# Patient Record
Sex: Female | Born: 1990 | Race: Black or African American | Hispanic: No | Marital: Single | Smoking: Never smoker
Health system: Southern US, Community
[De-identification: ages and names within clinical notes are randomized; demographics above are authoritative.]

## PROBLEM LIST (undated history)

## (undated) DIAGNOSIS — R52 Pain, unspecified: Secondary | ICD-10-CM

## (undated) DIAGNOSIS — M545 Low back pain, unspecified: Secondary | ICD-10-CM

## (undated) DIAGNOSIS — Z86711 Personal history of pulmonary embolism: Secondary | ICD-10-CM

## (undated) DIAGNOSIS — D649 Anemia, unspecified: Secondary | ICD-10-CM

## (undated) DIAGNOSIS — F1910 Other psychoactive substance abuse, uncomplicated: Secondary | ICD-10-CM

## (undated) DIAGNOSIS — N870 Mild cervical dysplasia: Secondary | ICD-10-CM

## (undated) DIAGNOSIS — A749 Chlamydial infection, unspecified: Secondary | ICD-10-CM

## (undated) DIAGNOSIS — F32A Depression, unspecified: Secondary | ICD-10-CM

## (undated) DIAGNOSIS — O98811 Other maternal infectious and parasitic diseases complicating pregnancy, first trimester: Secondary | ICD-10-CM

## (undated) DIAGNOSIS — F329 Major depressive disorder, single episode, unspecified: Secondary | ICD-10-CM

## (undated) DIAGNOSIS — Z79899 Other long term (current) drug therapy: Secondary | ICD-10-CM

## (undated) DIAGNOSIS — K219 Gastro-esophageal reflux disease without esophagitis: Secondary | ICD-10-CM

## (undated) DIAGNOSIS — N912 Amenorrhea, unspecified: Secondary | ICD-10-CM

## (undated) HISTORY — DX: Other maternal infectious and parasitic diseases complicating pregnancy, first trimester: O98.811

## (undated) HISTORY — DX: Mild cervical dysplasia: N87.0

## (undated) HISTORY — DX: Chlamydial infection, unspecified: A74.9

## (undated) HISTORY — DX: Personal history of pulmonary embolism: Z86.711

## (undated) HISTORY — PX: HIP SURGERY: SHX245

---
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
## 2011-03-28 ENCOUNTER — Emergency Department (HOSPITAL_COMMUNITY)
Admission: EM | Admit: 2011-03-28 | Discharge: 2011-03-29 | Disposition: A | Discharge status: Home / Self Care | Payer: Self-pay | Attending: Emergency Medicine | Admitting: Emergency Medicine

## 2011-03-28 ENCOUNTER — Encounter (HOSPITAL_COMMUNITY): Payer: Self-pay | Admitting: Emergency Medicine

## 2011-03-28 DIAGNOSIS — L259 Unspecified contact dermatitis, unspecified cause: Secondary | ICD-10-CM | POA: Insufficient documentation

## 2011-03-28 DIAGNOSIS — L309 Dermatitis, unspecified: Secondary | ICD-10-CM

## 2011-03-28 DIAGNOSIS — F3289 Other specified depressive episodes: Secondary | ICD-10-CM | POA: Insufficient documentation

## 2011-03-28 DIAGNOSIS — J45909 Unspecified asthma, uncomplicated: Secondary | ICD-10-CM | POA: Insufficient documentation

## 2011-03-28 DIAGNOSIS — L299 Pruritus, unspecified: Secondary | ICD-10-CM | POA: Insufficient documentation

## 2011-03-28 DIAGNOSIS — F329 Major depressive disorder, single episode, unspecified: Secondary | ICD-10-CM | POA: Insufficient documentation

## 2011-03-28 DIAGNOSIS — K219 Gastro-esophageal reflux disease without esophagitis: Secondary | ICD-10-CM | POA: Insufficient documentation

## 2011-03-28 DIAGNOSIS — Z79899 Other long term (current) drug therapy: Secondary | ICD-10-CM | POA: Insufficient documentation

## 2011-03-28 HISTORY — DX: Low back pain: M54.5

## 2011-03-28 HISTORY — DX: Major depressive disorder, single episode, unspecified: F32.9

## 2011-03-28 HISTORY — DX: Other psychoactive substance abuse, uncomplicated: F19.10

## 2011-03-28 HISTORY — DX: Anemia, unspecified: D64.9

## 2011-03-28 HISTORY — DX: Pain, unspecified: R52

## 2011-03-28 HISTORY — DX: Low back pain, unspecified: M54.50

## 2011-03-28 HISTORY — DX: Gastro-esophageal reflux disease without esophagitis: K21.9

## 2011-03-28 HISTORY — DX: Amenorrhea, unspecified: N91.2

## 2011-03-28 HISTORY — DX: Other long term (current) drug therapy: Z79.899

## 2011-03-28 HISTORY — DX: Depression, unspecified: F32.A

## 2011-03-28 NOTE — ED Notes (Signed)
PT. REPORTS GENERALIZED ITCHY RASHES ONSET THIS EVENING , DENIES NEW MEDICATIONS ,FOOD OR DETERGENT. RESPIRATIONS UNLABORED/ AIRWAY INTACT.

## 2011-03-29 MED ORDER — PREDNISONE 20 MG PO TABS
ORAL_TABLET | ORAL | Status: AC
  Filled 2011-03-29: qty 1

## 2011-03-29 MED ORDER — DIPHENHYDRAMINE HCL 50 MG/ML IJ SOLN
25.0000 mg | Freq: Once | INTRAMUSCULAR | Status: AC
  Administered 2011-03-29: 25 mg via INTRAVENOUS
  Filled 2011-03-29: qty 1

## 2011-03-29 MED ORDER — PREDNISONE 20 MG PO TABS: 40.0000 mg | ORAL_TABLET | Freq: Every day | ORAL | Status: AC

## 2011-03-29 MED ORDER — PREDNISONE 20 MG PO TABS
40.0000 mg | ORAL_TABLET | Freq: Once | ORAL | Status: AC
  Administered 2011-03-29: 40 mg via ORAL
  Filled 2011-03-29: qty 1

## 2011-03-29 NOTE — ED Provider Notes (Signed)
Medical screening examination/treatment/procedure(s) were performed by non-physician practitioner and as supervising physician I was immediately available for consultation/collaboration.  Troi Florendo K Jaysa Kise-Rasch, MD 03/29/11 0244 

## 2011-03-29 NOTE — ED Notes (Signed)
Pt c/o rash all over body with itching, onset approx 1 hr ago.  Does not know what she may be allergic to.  St's it feels like it is hard to breath.  All lung Jesus [place] clear at this time.

## 2011-03-29 NOTE — ED Provider Notes (Signed)
History     CSN: [id number] Arrival date & time: 03/28/2011 11:21 PM   First MD Initiated Contact with Patient 03/29/11 0036      Chief Complaint  Patient presents with  . Rash    (Consider location/radiation/quality/duration/timing/severity/associated sxs/prior treatment) HPI Comments: Patient here with a generalized itchy papular rash to entire body - she denies any new exposures to medication, detergents, perfumes.  Patient is a 20 y.o. female presenting with rash. The history is provided by the patient. No language interpreter was used.  Rash  This is a new problem. The current episode started yesterday. The problem has not changed since onset.The problem is associated with an unknown factor. There has been no fever. The rash is present on the scalp, head, torso, back, abdomen and trunk. The pain is at a severity of 0/10. The patient is experiencing no pain. Associated symptoms include itching. Pertinent negatives include no blisters, no pain and no weeping. She has tried nothing for the symptoms. The treatment provided no relief.    Past Medical History  Diagnosis Date  . Asthma   . Drug abuse, nondependent   . GERD (gastroesophageal reflux disease)   . Urinary incontinence   . Lumbago   . Anemia   . Amenorrhea   . Polypharmacy   . Depression   . Pain disorder     Past Surgical History  Procedure Date  . Hip surgery   . Leg surgery     No family history on file.  History  Substance Use Topics  . Smoking status: Never Smoker   . Smokeless tobacco: Not on file  . Alcohol Use: No    OB History    Grav Para Term Preterm Abortions TAB SAB Ect Mult Living                  Review of Systems  Skin: Positive for itching and rash.  All other systems reviewed and are negative.    Allergies  Ibuprofen and Keflex  Home Medications   Current Outpatient Rx  Name Route Sig Dispense Refill  . ALBUTEROL SULFATE HFA 108 (90 BASE) MCG/ACT IN AERS Inhalation  Inhale 2 puffs into the lungs every 6 (six) hours as needed. For wheezing     . APAP-PARABROM-PYRILAMINE 500-25-15 MG PO TABS Oral Take 1 tablet by mouth every 6 (six) hours as needed. For pain during menstrual cycle     . VITAMIN D3 2000 UNITS PO TABS Oral Take 1 tablet by mouth daily.      Marland Kitchen CLONAZEPAM 1 MG PO TABS Oral Take 1 mg by mouth 3 (three) times daily.      . DULOXETINE HCL 30 MG PO CPEP Oral Take 30 mg by mouth daily.      . DULOXETINE HCL 60 MG PO CPEP Oral Take 60 mg by mouth daily.      Marland Kitchen FEXOFENADINE-PSEUDOEPHED ER 180-240 MG PO TB24 Oral Take 1 tablet by mouth daily.      Marland Kitchen FLUTICASONE PROPIONATE 50 MCG/ACT NA SUSP Nasal Place 2 sprays into the nose daily.      Marland Kitchen GABAPENTIN 300 MG PO CAPS Oral Take 300 mg by mouth at bedtime.      Marland Kitchen GABAPENTIN 800 MG PO TABS Oral Take 800 mg by mouth 3 (three) times daily.      Marland Kitchen METHOCARBAMOL 500 MG PO TABS Oral Take 500 mg by mouth 3 (three) times daily.      Marland Kitchen MONTELUKAST SODIUM 10 MG PO  TABS Oral Take 10 mg by mouth daily.      Carma Leaven M PLUS PO TABS Oral Take 1 tablet by mouth daily.      Marland Kitchen OMEPRAZOLE 20 MG PO CPDR Oral Take 20 mg by mouth every morning.      Marland Kitchen ONDANSETRON HCL 4 MG PO TABS Oral Take 4 mg by mouth every 8 (eight) hours as needed. For nausea     . OXYBUTYNIN CHLORIDE 5 MG PO TABS Oral Take 5 mg by mouth 2 (two) times daily.      . OXYCODONE-ACETAMINOPHEN 5-325 MG PO TABS Oral Take 1-2 tablets by mouth 3 (three) times daily. Take 1 tab twice daily & 2 tabs at night     . PODOFILOX 0.5 % EX SOLN Topical Apply 1 application topically See admin instructions. Apply once daily x3 days then off for 4 days. Repeat x4 weeks.     Marland Kitchen PREGABALIN 150 MG PO CAPS Oral Take 150 mg by mouth 2 (two) times daily.      . TRAZODONE HCL 100 MG PO TABS Oral Take 100-200 mg by mouth at bedtime.      Marland Kitchen LIDOCAINE VISCOUS 2 % MT SOLN Oral Take 5-10 mLs by mouth at bedtime as needed. Gargle with 1-2 tsp at night if needed for sore throat       BP  132/70  Pulse 98  Temp(Src) 98.1 F (36.7 C) (Oral)  Resp 20  SpO2 96%  LMP 03/16/2011  Physical Exam  Nursing note and vitals reviewed. Constitutional: She is oriented to person, place, and time. She appears well-developed and well-nourished. No distress.  HENT:  Head: Normocephalic and atraumatic.  Right Ear: External ear normal.  Left Ear: External ear normal.  Mouth/Throat: No oropharyngeal exudate.  Eyes: Conjunctivae are normal. Pupils are equal, round, and reactive to light.  Neck: Normal range of motion. Neck supple.  Cardiovascular: Normal rate, regular rhythm and normal heart sounds.   Pulmonary/Chest: Effort normal and breath sounds normal. No respiratory distress. She has no wheezes.  Abdominal: Soft. Bowel sounds are normal. She exhibits no distension.  Musculoskeletal: Normal range of motion.  Neurological: She is alert and oriented to person, place, and time.  Skin: Skin is warm and dry. Rash noted.       Diffuse papular itchy rash to entire body    ED Course  Procedures (including critical care time)  Labs Reviewed - No data to display No results found.   Dermatitis    MDM  Patient with diffuse itchy rash - will discharge with benadryl and a course of steriods, I do not think this is scabies due to the presentation.       Izola Price [place], [place] 03/29/11 [phone]

## 2011-04-14 ENCOUNTER — Emergency Department (HOSPITAL_COMMUNITY)
Admission: EM | Admit: 2011-04-14 | Discharge: 2011-04-14 | Disposition: A | Discharge status: Home / Self Care | Payer: Self-pay | Attending: Emergency Medicine | Admitting: Emergency Medicine

## 2011-04-14 ENCOUNTER — Encounter (HOSPITAL_COMMUNITY): Payer: Self-pay | Admitting: Nurse Practitioner

## 2011-04-14 DIAGNOSIS — J069 Acute upper respiratory infection, unspecified: Secondary | ICD-10-CM | POA: Insufficient documentation

## 2011-04-14 DIAGNOSIS — R059 Cough, unspecified: Secondary | ICD-10-CM | POA: Insufficient documentation

## 2011-04-14 DIAGNOSIS — B370 Candidal stomatitis: Secondary | ICD-10-CM | POA: Insufficient documentation

## 2011-04-14 DIAGNOSIS — J45909 Unspecified asthma, uncomplicated: Secondary | ICD-10-CM | POA: Insufficient documentation

## 2011-04-14 DIAGNOSIS — J3489 Other specified disorders of nose and nasal sinuses: Secondary | ICD-10-CM | POA: Insufficient documentation

## 2011-04-14 DIAGNOSIS — R509 Fever, unspecified: Secondary | ICD-10-CM | POA: Insufficient documentation

## 2011-04-14 DIAGNOSIS — K219 Gastro-esophageal reflux disease without esophagitis: Secondary | ICD-10-CM | POA: Insufficient documentation

## 2011-04-14 DIAGNOSIS — R05 Cough: Secondary | ICD-10-CM

## 2011-04-14 MED ORDER — HYDROCOD POLST-CHLORPHEN POLST 10-8 MG/5ML PO LQCR: 5.0000 mL | Freq: Two times a day (BID) | ORAL | Status: DC | PRN

## 2011-04-14 MED ORDER — FLUCONAZOLE 100 MG PO TABS: 100.0000 mg | ORAL_TABLET | Freq: Every day | ORAL | Status: AC

## 2011-04-14 NOTE — ED Provider Notes (Signed)
History     CSN: [id number]  Arrival date & time 04/14/11  1751   First MD Initiated Contact with Patient 04/14/11 2049      Chief Complaint  Patient presents with  . Nasal Congestion  . URI    (Consider location/radiation/quality/duration/timing/severity/associated sxs/prior treatment) HPI Comments: Patient with 7 day history of upper respiratory symptoms. She was seen at another hospital and diagnosed with bronchitis after having a chest x-ray. Patient was discharged home on azithromycin. She continues to have intermittent fevers, cough, nasal congestion. She denies vomiting. Patient has an albuterol inhaler that she has not been using. Patient subsequently developed oral thrush after taking antibiotics. She says this has happened to her before after taking Keflex.  Patient is a 20 y.o. female presenting with URI. The history is provided by the patient.  URI The primary symptoms include fever, fatigue, ear pain, sore throat, cough, wheezing and nausea. Primary symptoms do not include headaches, swollen glands, abdominal pain, vomiting, myalgias or rash. The current episode started more than 1 week ago.  Symptoms associated with the illness include congestion and rhinorrhea. The illness is not associated with chills.    Past Medical History  Diagnosis Date  . Asthma   . Drug abuse, nondependent   . GERD (gastroesophageal reflux disease)   . Urinary incontinence   . Lumbago   . Anemia   . Amenorrhea   . Polypharmacy   . Depression   . Pain disorder     Past Surgical History  Procedure Date  . Hip surgery   . Leg surgery     History reviewed. No pertinent family history.  History  Substance Use Topics  . Smoking status: Never Smoker   . Smokeless tobacco: Not on file  . Alcohol Use: No    OB History    Grav Para Term Preterm Abortions TAB SAB Ect Mult Living                  Review of Systems  Constitutional: Positive for fever and fatigue. Negative for  chills.  HENT: Positive for ear pain, congestion, sore throat and rhinorrhea.   Eyes: Negative for discharge.  Respiratory: Positive for cough and wheezing. Negative for shortness of breath.   Cardiovascular: Negative for chest pain.  Gastrointestinal: Positive for nausea. Negative for vomiting, abdominal pain, diarrhea and constipation.  Genitourinary: Negative for dysuria and hematuria.  Musculoskeletal: Negative for myalgias.  Skin: Negative for rash.  Neurological: Negative for headaches.  Psychiatric/Behavioral: Negative for confusion.    Allergies  Ibuprofen and Keflex  Home Medications   Current Outpatient Rx  Name Route Sig Dispense Refill  . ACETAMINOPHEN 500 MG PO TABS Oral Take 1,000 mg by mouth every 6 (six) hours as needed. For pain     . ALBUTEROL SULFATE HFA 108 (90 BASE) MCG/ACT IN AERS Inhalation Inhale 2 puffs into the lungs every 6 (six) hours as needed. For wheezing    . VITAMIN D3 2000 UNITS PO TABS Oral Take 1 tablet by mouth daily.      Marland Kitchen CLONAZEPAM 1 MG PO TABS Oral Take 1 mg by mouth 3 (three) times daily.      Marland Kitchen DICLOFENAC SODIUM 1 % TD GEL Topical Apply 1 application topically 4 (four) times daily as needed. For pain in chest     . FERROUS SULFATE 325 (65 FE) MG PO TABS Oral Take 325 mg by mouth daily with breakfast.      . FEXOFENADINE-PSEUDOEPHED ER 180-240 MG  PO TB24 Oral Take 1 tablet by mouth daily.      Marland Kitchen FLUTICASONE PROPIONATE 50 MCG/ACT NA SUSP Nasal Place 2 sprays into the nose daily.      Marland Kitchen GABAPENTIN 300 MG PO CAPS Oral Take 300 mg by mouth at bedtime.      Marland Kitchen GABAPENTIN 400 MG PO CAPS Oral Take 400 mg by mouth 3 (three) times daily.      Marland Kitchen HYDROXYZINE HCL 25 MG PO TABS Oral Take 25 mg by mouth 3 (three) times daily.      Marland Kitchen LIDOCAINE VISCOUS 2 % MT SOLN Oral Take 5-10 mLs by mouth at bedtime as needed. Gargle with 1-2 tsp at night if needed for sore throat     . METHOCARBAMOL 500 MG PO TABS Oral Take 500 mg by mouth 3 (three) times daily.      Marland Kitchen  MONTELUKAST SODIUM 10 MG PO TABS Oral Take 10 mg by mouth daily.      Carma Leaven M PLUS PO TABS Oral Take 1 tablet by mouth daily.      Marland Kitchen OMEPRAZOLE 20 MG PO CPDR Oral Take 20 mg by mouth every morning.      Marland Kitchen ONDANSETRON 4 MG PO TBDP Oral Take 4 mg by mouth 2 (two) times daily.      . OXYBUTYNIN CHLORIDE 5 MG PO TABS Oral Take 5 mg by mouth 2 (two) times daily.      . OXYCODONE HCL 5 MG PO TABS Oral Take 5-10 mg by mouth See admin instructions. For pain.  Take 1 tablet by mouth twice daily and 2 tablets by mouth at bedtime     . PREDNISONE 10 MG PO TABS Oral Take 10 mg by mouth daily.      Marland Kitchen PREGABALIN 150 MG PO CAPS Oral Take 150 mg by mouth 2 (two) times daily.      Marland Kitchen PROMETHAZINE-CODEINE 6.25-10 MG/5ML PO SYRP Oral Take 5 mLs by mouth every 4 (four) hours as needed. For nausea     . SENNOSIDES-DOCUSATE SODIUM 8.6-50 MG PO TABS Oral Take 1 tablet by mouth 2 (two) times daily.      . TRAZODONE HCL 100 MG PO TABS Oral Take 200 mg by mouth at bedtime.     . VENLAFAXINE HCL 37.5 MG PO CP24 Oral Take 37.5 mg by mouth daily.      Marland Kitchen APAP-PARABROM-PYRILAMINE 500-25-15 MG PO TABS Oral Take 1 tablet by mouth every 6 (six) hours as needed. For pain during menstrual cycle     . HYDROCOD POLST-CHLORPHEN POLST 10-8 MG/5ML PO LQCR Oral Take 5 mLs by mouth every 12 (twelve) hours as needed. 115 mL 0  . FLUCONAZOLE 100 MG PO TABS Oral Take 1 tablet (100 mg total) by mouth daily. Take 2 tabs PO on day one and 1 tab PO days 2-7 8 tablet 0  . PODOFILOX 0.5 % EX SOLN Topical Apply 1 application topically 2 (two) times daily.       BP 120/71  Pulse 102  Temp(Src) 99.1 F (37.3 C) (Oral)  Resp 20  Ht 5\' 1"  (1.549 m)  Wt 230 lb (104.327 kg)  BMI 43.46 kg/m2  SpO2 96%  LMP 03/16/2011  Physical Exam  Nursing note and vitals reviewed. Constitutional: She is oriented to person, place, and time. She appears well-developed and well-nourished.  HENT:  Head: Normocephalic and atraumatic.  Right Ear: Tympanic  membrane and external ear normal.  Left Ear: Tympanic membrane and external ear normal.  Nose: Rhinorrhea present. No mucosal edema.  Mouth/Throat: Uvula is midline. Posterior oropharyngeal erythema present. No oropharyngeal exudate.       Patient with white coating on her tongue consistent with oral candidiasis.  Eyes: Pupils are equal, round, and reactive to light. Right eye exhibits no discharge. Left eye exhibits no discharge.  Neck: Normal range of motion. Neck supple.  Cardiovascular: Normal rate and regular rhythm.  Exam reveals no gallop and no friction rub.   No murmur heard. Pulmonary/Chest: Effort normal. No respiratory distress. She has wheezes.       Patient with mild diffuse expiratory wheezing  Abdominal: Soft. There is no tenderness. There is no rebound and no guarding.  Musculoskeletal: Normal range of motion.  Neurological: She is alert and oriented to person, place, and time.  Skin: Skin is warm and dry. No rash noted.  Psychiatric: She has a normal mood and affect.    ED Course  Procedures (including critical care time)  Labs Reviewed - No data to display No results found.   1. Viral upper respiratory illness   2. Cough   3. Oral candidiasis    9:54 PM patient seen and examined. Will give medication for cough and oral thrush. Patient urged to use inhaler every 6 hours for the next 2-3 days for symptoms. Patient counseled on supportive care for viral URI and s/s to return including worsening symptoms, persistent fever, persistent vomiting, or if they have any other concerns.  Urged to see PCP if symptoms persist for more than 3 days. Patient verbalizes understanding and agrees with plan.   9:54 PM Patient counseled on use of narcotic pain medications. Counseled not to combine these medications with others containing tylenol. Urged not to drink alcohol, drive, or perform any other activities that requires focus while taking these medications. The patient verbalizes  understanding and agrees with the plan.   MDM  Patient with symptoms consistent with a viral syndrome.  Oral thrush is secondary to previous antibiotic use. Patient was treated with azithromycin. I do not suspect a secondary bacterial infection at this time. Vitals are stable, no fever.  No signs of dehydration.  Lung exam normal, no signs of pneumonia.  Supportive therapy indicated with return if symptoms worsen.     Medical screening examination/treatment/procedure(s) were performed by non-physician practitioner and as supervising physician I was immediately available for consultation/collaboration. Osvaldo Human, M.D.       Eustace Moore [place], [place] 04/14/11 2200  Carleene Cooper III, MD 04/15/11 [phone]

## 2011-04-14 NOTE — ED Notes (Signed)
Reports diagnosed with bronchitis at [facility] on 12/24, completed a course of azithromycin with no relief of symptoms. C/o weakness, cough, chills, nausea, body aches, not feeling well still

## 2011-04-14 NOTE — ED Notes (Signed)
Patient was seen at a different hospital on the 25th and diagnosed with bronchitis and give azithromycin (patient has finished prescription); patient states that ever since she was diagnosed, her symptoms have been getting worse.  Patient complaining of chest congestion, nasal congestion, headache, productive cough, burning/watery eyes, low back pain, sore throat, and nausea.  Upon assessment, patient actively coughing.  Expiratory wheezing heard bilaterally upon ascultation.  Patient alert and oriented x4; PERRL present.  Will continue to monitor.

## 2011-04-14 NOTE — ED Notes (Signed)
Patient instructed to drink plenty of fluids, to take medications as directed, to follow up with primary care physician, and to return to the ED for new, worsening, or concerning symptoms.

## 2011-09-05 LAB — OB RESULTS CONSOLE VARICELLA ZOSTER ANTIBODY, IGG: VARICELLA IGG: IMMUNE

## 2011-10-22 ENCOUNTER — Emergency Department (HOSPITAL_COMMUNITY)
Admission: EM | Admit: 2011-10-22 | Discharge: 2011-10-22 | Disposition: A | Discharge status: Home / Self Care | Payer: Self-pay | Attending: Emergency Medicine | Admitting: Emergency Medicine

## 2011-10-22 ENCOUNTER — Encounter (HOSPITAL_COMMUNITY): Payer: Self-pay | Admitting: Emergency Medicine

## 2011-10-22 DIAGNOSIS — J45909 Unspecified asthma, uncomplicated: Secondary | ICD-10-CM | POA: Insufficient documentation

## 2011-10-22 DIAGNOSIS — Z79899 Other long term (current) drug therapy: Secondary | ICD-10-CM | POA: Insufficient documentation

## 2011-10-22 DIAGNOSIS — F4310 Post-traumatic stress disorder, unspecified: Secondary | ICD-10-CM | POA: Insufficient documentation

## 2011-10-22 DIAGNOSIS — R5381 Other malaise: Secondary | ICD-10-CM | POA: Insufficient documentation

## 2011-10-22 DIAGNOSIS — K219 Gastro-esophageal reflux disease without esophagitis: Secondary | ICD-10-CM | POA: Insufficient documentation

## 2011-10-22 DIAGNOSIS — R5383 Other fatigue: Secondary | ICD-10-CM | POA: Insufficient documentation

## 2011-10-22 LAB — URINALYSIS, ROUTINE W REFLEX MICROSCOPIC
Bilirubin Urine: NEGATIVE
Hgb urine dipstick: NEGATIVE
Ketones, ur: 15 mg/dL — AB
Protein, ur: NEGATIVE mg/dL
Urobilinogen, UA: 1 mg/dL (ref 0.0–1.0)

## 2011-10-22 LAB — RAPID URINE DRUG SCREEN, HOSP PERFORMED
Amphetamines: NOT DETECTED
Barbiturates: NOT DETECTED
Benzodiazepines: POSITIVE — AB
Cocaine: NOT DETECTED
Tetrahydrocannabinol: NOT DETECTED

## 2011-10-22 LAB — COMPREHENSIVE METABOLIC PANEL
ALT: 14 U/L (ref 0–35)
AST: 20 U/L (ref 0–37)
Alkaline Phosphatase: 69 U/L (ref 39–117)
Calcium: 9.8 mg/dL (ref 8.4–10.5)
Potassium: 3.4 mEq/L — ABNORMAL LOW (ref 3.5–5.1)
Sodium: 138 mEq/L (ref 135–145)
Total Protein: 7.9 g/dL (ref 6.0–8.3)

## 2011-10-22 LAB — URINE MICROSCOPIC-ADD ON

## 2011-10-22 LAB — CBC
MCH: 28.3 pg (ref 26.0–34.0)
MCHC: 34.7 g/dL (ref 30.0–36.0)
Platelets: 354 10*3/uL (ref 150–400)
RBC: 4.81 MIL/uL (ref 3.87–5.11)

## 2011-10-22 NOTE — ED Notes (Signed)
Patient states she has been missing some doses of her medications due to her work schedule and now she is feeling dehydrated and tired all the time and the patient states she wants to make sure nothing bad is going on with her. Patient denies SI and denies HI.

## 2011-10-22 NOTE — ED Provider Notes (Signed)
History     CSN: [id number]  Arrival date & time 10/22/11  1443   First MD Initiated Contact with Patient 10/22/11 1642      Chief Complaint  Patient presents with  . Medical Clearance    (Consider location/radiation/quality/duration/timing/severity/associated sxs/prior treatment) HPI Comments: Patient with a history of depression and PTSD comes in complaining of general fatigue. She states that she's been working long hours and leaves early in the morning doesn't get home until late at night so she hasn't been taking her medication regularly and she just feels tired and fatigued. She denies any hallucinations, suicidal or homicidal ideations. She actually denies any psychiatric complaints she just complains of fatigue. She denies any recent illnesses. The fatigue has been going on for the last several weeks and seems to be waxing and waning intensity. She denies any fevers, cough, congestion, nausea vomiting diarrhea, or any UTI symptoms. Denies any abdominal pain. She's having regular menstrual periods.  The history is provided by the patient.    Past Medical History  Diagnosis Date  . Asthma   . Drug abuse, nondependent   . GERD (gastroesophageal reflux disease)   . Urinary incontinence   . Lumbago   . Anemia   . Amenorrhea   . Polypharmacy   . Depression   . Pain disorder     Past Surgical History  Procedure Date  . Hip surgery   . Leg surgery     History reviewed. No pertinent family history.  History  Substance Use Topics  . Smoking status: Never Smoker   . Smokeless tobacco: Not on file  . Alcohol Use: No    OB History    Grav Para Term Preterm Abortions TAB SAB Ect Mult Living                  Review of Systems  Constitutional: Positive for fatigue. Negative for fever, chills and diaphoresis.  HENT: Negative for congestion, rhinorrhea and sneezing.   Eyes: Negative.   Respiratory: Negative for cough, chest tightness and shortness of breath.     Cardiovascular: Negative for chest pain and leg swelling.  Gastrointestinal: Negative for nausea, vomiting, abdominal pain, diarrhea and blood in stool.  Genitourinary: Negative for frequency, hematuria, flank pain and difficulty urinating.  Musculoskeletal: Negative for back pain and arthralgias.  Skin: Negative for rash.  Neurological: Negative for dizziness, speech difficulty, weakness, numbness and headaches.  Psychiatric/Behavioral: Negative for suicidal ideas, hallucinations, behavioral problems, confusion and agitation. The patient is not nervous/anxious.     Allergies  Ibuprofen and Keflex  Home Medications   Current Outpatient Rx  Name Route Sig Dispense Refill  . ACETAMINOPHEN 500 MG PO TABS Oral Take 1,000 mg by mouth every 6 (six) hours as needed. For pain     . APAP-PARABROM-PYRILAMINE 500-25-15 MG PO TABS Oral Take 1 tablet by mouth every 6 (six) hours as needed. For pain during menstrual cycle     . CLONAZEPAM 1 MG PO TABS Oral Take 1 mg by mouth 3 (three) times daily.      Marland Kitchen FLUTICASONE PROPIONATE 50 MCG/ACT NA SUSP Nasal Place 2 sprays into the nose daily.      Marland Kitchen GABAPENTIN 300 MG PO CAPS Oral Take 300 mg by mouth at bedtime.      Marland Kitchen GABAPENTIN 400 MG PO CAPS Oral Take 400 mg by mouth 3 (three) times daily.      Marland Kitchen HYDROXYZINE HCL 25 MG PO TABS Oral Take 25 mg by mouth  3 (three) times daily.      Marland Kitchen LIDOCAINE VISCOUS 2 % MT SOLN Oral Take 5-10 mLs by mouth at bedtime as needed. Gargle with 1-2 tsp at night if needed for sore throat     . LORATADINE-PSEUDOEPHEDRINE ER 10-240 MG PO TB24 Oral Take 1 tablet by mouth daily.    Marland Kitchen METHOCARBAMOL 500 MG PO TABS Oral Take 500 mg by mouth 3 (three) times daily.      Marland Kitchen MONTELUKAST SODIUM 10 MG PO TABS Oral Take 10 mg by mouth daily.      Marland Kitchen OMEPRAZOLE 20 MG PO CPDR Oral Take 20 mg by mouth 2 (two) times daily.     Marland Kitchen ONDANSETRON 4 MG PO TBDP Oral Take 4 mg by mouth 2 (two) times daily.      . OXYBUTYNIN CHLORIDE 5 MG PO TABS Oral Take  5 mg by mouth 2 (two) times daily.      Marland Kitchen PREGABALIN 150 MG PO CAPS Oral Take 150 mg by mouth 2 (two) times daily.      Marland Kitchen PRENATAL MULTIVITAMIN CH Oral Take 1 tablet by mouth daily.    Bernadette Hoit SODIUM 8.6-50 MG PO TABS Oral Take 1 tablet by mouth 2 (two) times daily.      . SUMATRIPTAN SUCCINATE 25 MG PO TABS Oral Take 25 mg by mouth 3 (three) times daily as needed. For headaches    . TRAZODONE HCL 100 MG PO TABS Oral Take 200 mg by mouth at bedtime.     . ALBUTEROL SULFATE HFA 108 (90 BASE) MCG/ACT IN AERS Inhalation Inhale 2 puffs into the lungs every 6 (six) hours as needed. For wheezing      BP 106/59  Pulse 73  Temp 98.8 F (37.1 C) (Oral)  Resp 20  SpO2 100%  Physical Exam  Constitutional: She is oriented to person, place, and time. She appears well-developed and well-nourished.  HENT:  Head: Normocephalic and atraumatic.  Eyes: Pupils are equal, round, and reactive to light.  Neck: Normal range of motion. Neck supple.  Cardiovascular: Normal rate, regular rhythm and normal heart sounds.   Pulmonary/Chest: Effort normal and breath sounds normal. No respiratory distress. She has no wheezes. She has no rales. She exhibits no tenderness.  Abdominal: Soft. Bowel sounds are normal. There is no tenderness. There is no rebound and no guarding.  Musculoskeletal: Normal range of motion. She exhibits no edema.  Lymphadenopathy:    She has no cervical adenopathy.  Neurological: She is alert and oriented to person, place, and time.  Skin: Skin is warm and dry. No rash noted.  Psychiatric: She has a normal mood and affect.    ED Course  Procedures (including critical care time)  Results for orders placed during the hospital encounter of 10/22/11  CBC      Component Value Range   WBC 7.9  4.0 - 10.5 K/uL   RBC 4.81  3.87 - 5.11 MIL/uL   Hemoglobin 13.6  12.0 - 15.0 g/dL   HCT 16.1  09.6 - 04.5 %   MCV 81.5  78.0 - 100.0 fL   MCH 28.3  26.0 - 34.0 pg   MCHC 34.7  30.0  - 36.0 g/dL   RDW 40.9  81.1 - 91.4 %   Platelets 354  150 - 400 K/uL  COMPREHENSIVE METABOLIC PANEL      Component Value Range   Sodium 138  135 - 145 mEq/L   Potassium 3.4 (*) 3.5 - 5.1 mEq/L  Chloride 101  96 - 112 mEq/L   CO2 26  19 - 32 mEq/L   Glucose, Bld 88  70 - 99 mg/dL   BUN 9  6 - 23 mg/dL   Creatinine, Ser 0.98  0.50 - 1.10 mg/dL   Calcium 9.8  8.4 - 11.9 mg/dL   Total Protein 7.9  6.0 - 8.3 g/dL   Albumin 4.2  3.5 - 5.2 g/dL   AST 20  0 - 37 U/L   ALT 14  0 - 35 U/L   Alkaline Phosphatase 69  39 - 117 U/L   Total Bilirubin 0.3  0.3 - 1.2 mg/dL   GFR calc non Af Amer >90  >90 mL/min   GFR calc Af Amer >90  >90 mL/min  ETHANOL      Component Value Range   Alcohol, Ethyl (B) <11  0 - 11 mg/dL  URINE RAPID DRUG SCREEN (HOSP PERFORMED)      Component Value Range   Opiates NONE DETECTED  NONE DETECTED   Cocaine NONE DETECTED  NONE DETECTED   Benzodiazepines POSITIVE (*) NONE DETECTED   Amphetamines NONE DETECTED  NONE DETECTED   Tetrahydrocannabinol NONE DETECTED  NONE DETECTED   Barbiturates NONE DETECTED  NONE DETECTED  POCT PREGNANCY, URINE      Component Value Range   Preg Test, Ur NEGATIVE  NEGATIVE  URINALYSIS, ROUTINE W REFLEX MICROSCOPIC      Component Value Range   Color, Urine AMBER (*) YELLOW   APPearance HAZY (*) CLEAR   Specific Gravity, Urine 1.028  1.005 - 1.030   pH 6.0  5.0 - 8.0   Glucose, UA NEGATIVE  NEGATIVE mg/dL   Hgb urine dipstick NEGATIVE  NEGATIVE   Bilirubin Urine NEGATIVE  NEGATIVE   Ketones, ur 15 (*) NEGATIVE mg/dL   Protein, ur NEGATIVE  NEGATIVE mg/dL   Urobilinogen, UA 1.0  0.0 - 1.0 mg/dL   Nitrite NEGATIVE  NEGATIVE   Leukocytes, UA TRACE (*) NEGATIVE  URINE MICROSCOPIC-ADD ON      Component Value Range   Squamous Epithelial / LPF FEW (*) RARE   WBC, UA 0-2  <3 WBC/hpf   Bacteria, UA RARE  RARE   Urine-Other MUCOUS PRESENT     No results found.    1. Fatigue       MDM  Patient is well-appearing, is here  with her dad, there is no apparent psychiatric issues. Her blood work and urine looks okay. Advise her to followup with her primary care physician return here as needed for any worsening symptoms        Rolan Bucco, MD 10/22/11 [phone]

## 2011-10-22 NOTE — ED Notes (Signed)
The patient is AOx4 and comfortable with her discharge instructions. 

## 2011-10-22 NOTE — ED Notes (Signed)
Pt here for eval because sts she has not been taking her mental health meds as she should and is now having trouble eating of functioning; pt sts feels tired; pt denies SI/HI

## 2011-10-22 NOTE — ED Notes (Signed)
Visitor escorted back to patient's room to visit patient; visitor pass given.

## 2012-06-07 IMAGING — CT CT ANGIO CHEST
2 of 6 series · 19 of 36 positions shown · IV contrast (APPLIED)
Comparison: None.

CLINICAL DATA: Chest pain

CT ANGIOGRAPHY CHEST WITH CONTRAST
TECHNIQUE: Multidetector CT imaging of the chest was performed
using the standard protocol during bolus administration of
intravenous contrast.  Multiplanar CT image reconstructions
including MIPs were obtained to evaluate the vascular anatomy.
Contrast:  100 ml of Dmnipaque-7GG

[Series 10: pulm embolism 1.0 b25f thins · axial · 0.70mm/px · z∈[-138,+66]mm · 18 of 228 slices shown]
[im 12/228  lung]
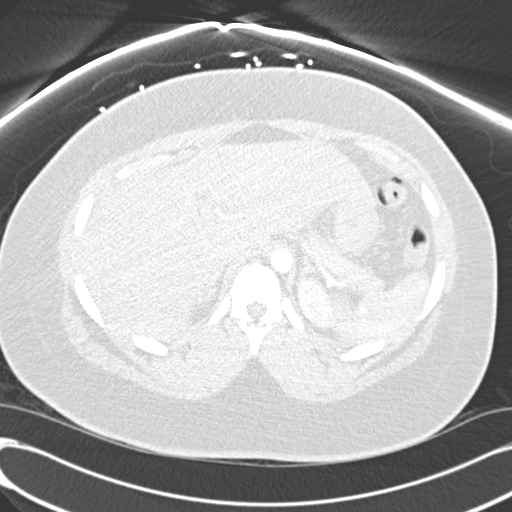
[im 23/228  mediastinal]
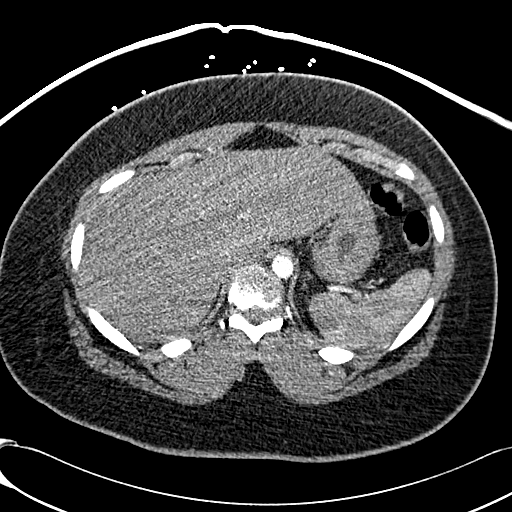
[im 35/228  lung]
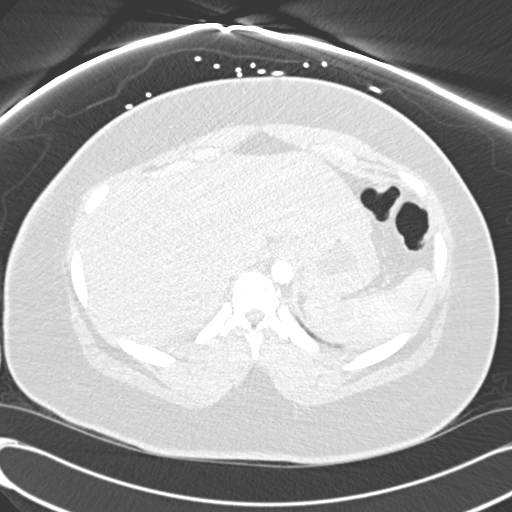
[im 46/228  mediastinal]
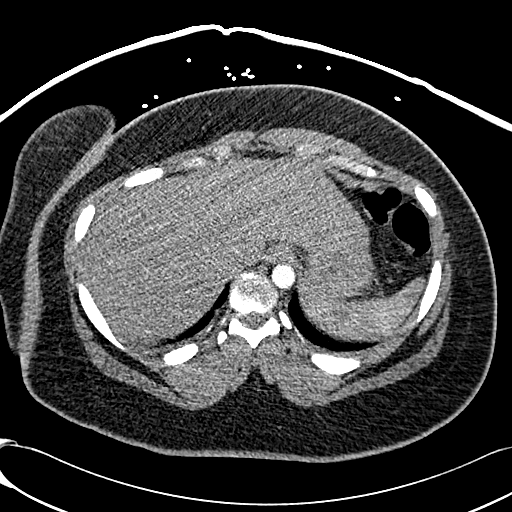
[im 57/228  lung]
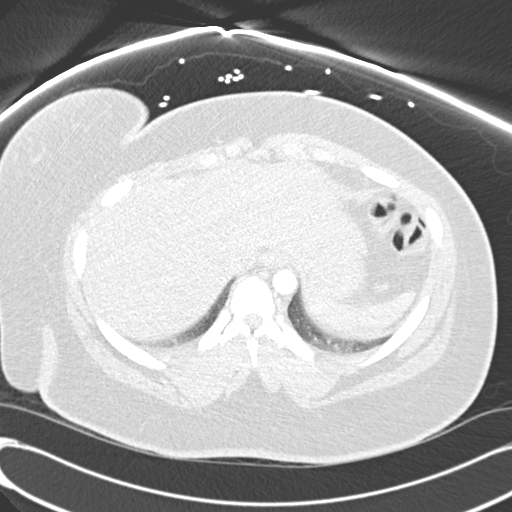
[im 69/228  mediastinal]
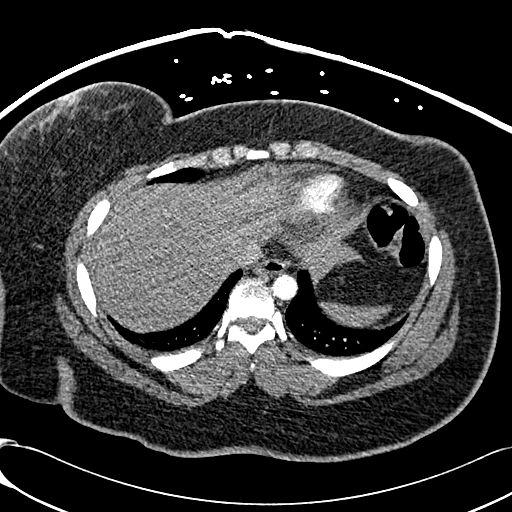
[im 80/228  lung]
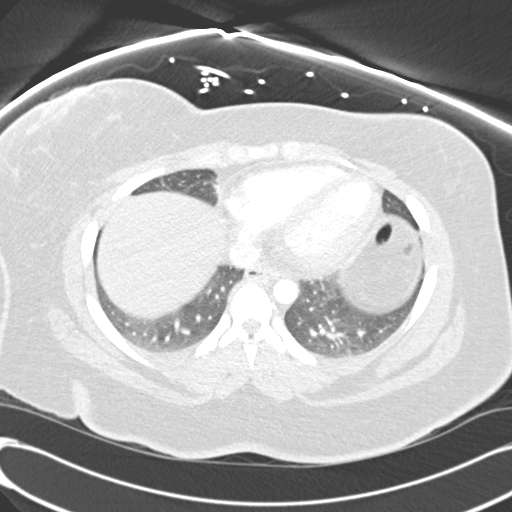
[im 91/228  mediastinal]
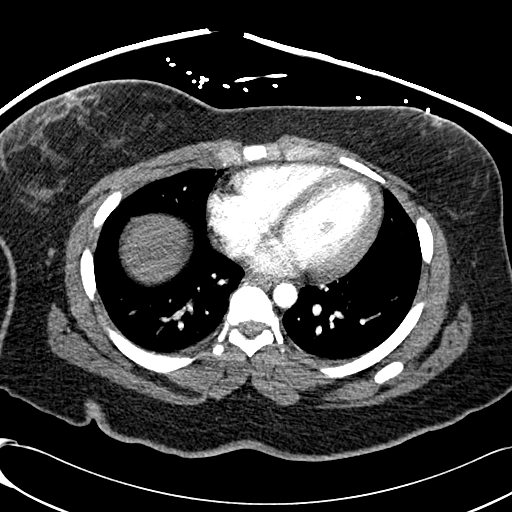
[im 103/228  lung]
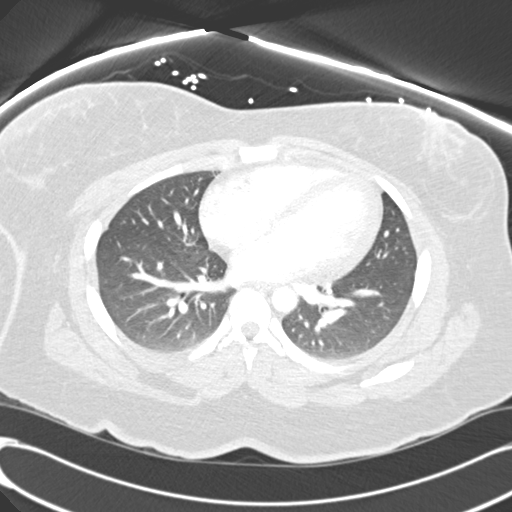
[im 125/228  mediastinal]
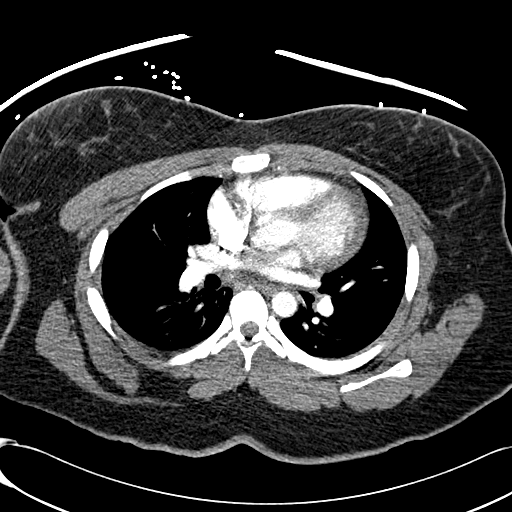
[im 137/228  lung]
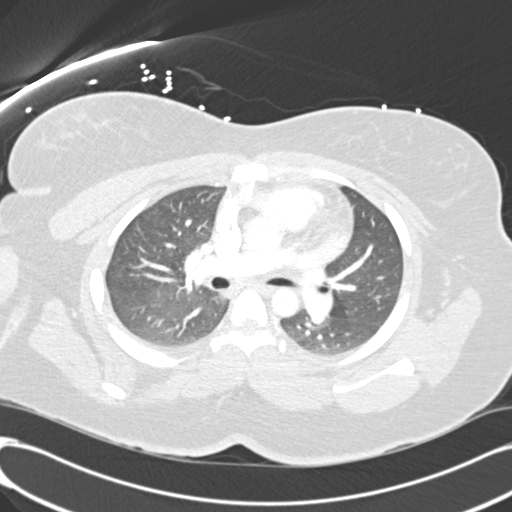
[im 148/228  mediastinal]
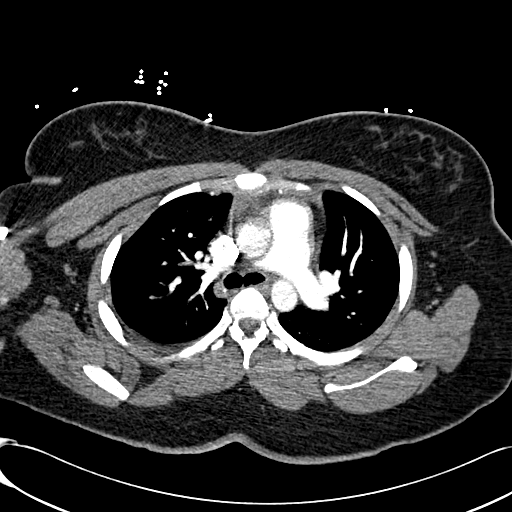
[im 159/228  lung]
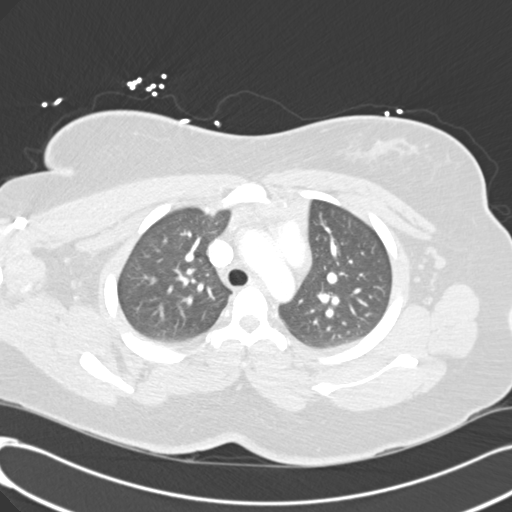
[im 171/228  mediastinal]
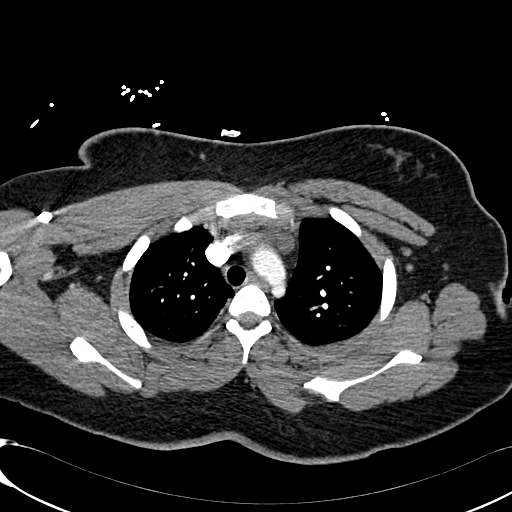
[im 182/228  lung]
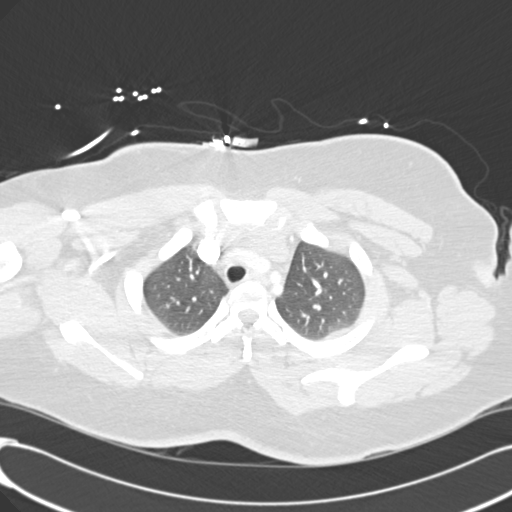
[im 193/228  mediastinal]
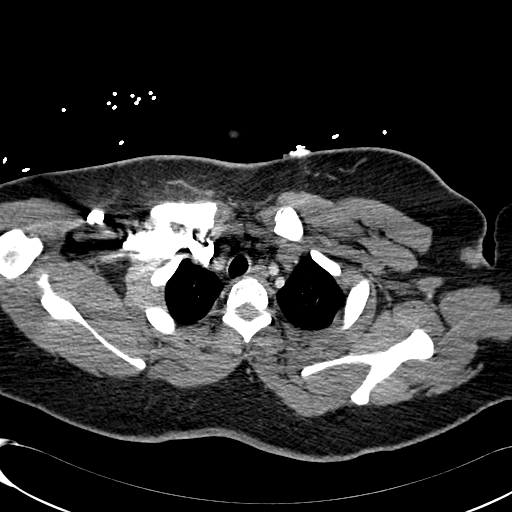
[im 205/228  lung]
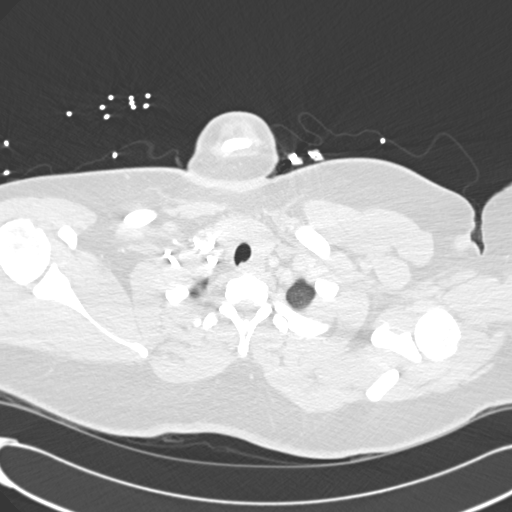
[im 216/228  mediastinal]
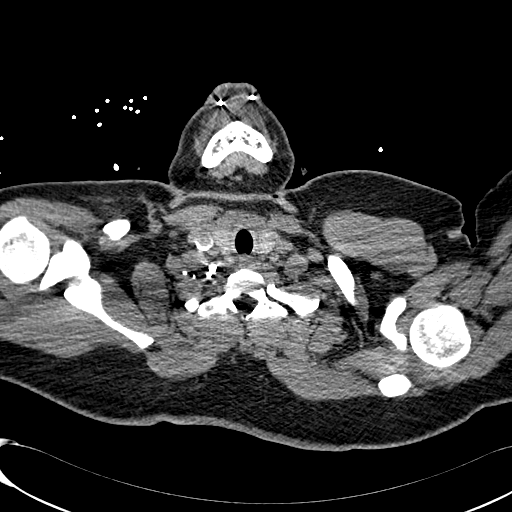

[Series 604: coronal · coronal · 0.70mm/px · 1 of 66 slices shown]
[im 33/66  mediastinal]
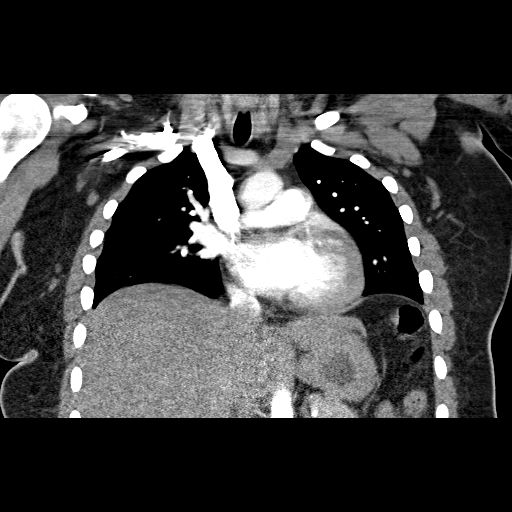

[19 of 36 positions shown; findings below may reference images not displayed]

FINDINGS: No filling defects are seen in the central, segmental or
subsegmental pulmonary arteries.  The pulmonary artery is normal in
size.  The heart is also normal in size without pericardial
effusion.  The aorta and major branch vessels are patent.  The
visualized thyroid is normal.  No mediastinal or hilar adenopathy
is present.  There is also no axillary adenopathy.  The lungs are
clear.  Minimal dependent atelectasis is seen bilaterally.  The
trachea and central airways are patent.  There are no pleural
effusions.

No aggressive lytic or blastic bony lesions are seen.  There is no
acute fracture.  A vague area of enhancement is seen in the
anterior aspect of the right hepatic lobe measuring approximately
10 mm, likely, a flash fill hemangioma.  The upper abdomen is
otherwise normal.

Review of the MIP images confirms the above findings.
IMPRESSION: 1.  No acute findings in the abdomen.  Specifically, no pulmonary
embolism is seen.
2.  Hypervascular liver lesion, likely, a flash fill hemangioma.

## 2012-07-20 DIAGNOSIS — I2699 Other pulmonary embolism without acute cor pulmonale: Secondary | ICD-10-CM | POA: Insufficient documentation

## 2012-07-20 DIAGNOSIS — F329 Major depressive disorder, single episode, unspecified: Secondary | ICD-10-CM | POA: Insufficient documentation

## 2012-07-21 DIAGNOSIS — IMO0002 Reserved for concepts with insufficient information to code with codable children: Secondary | ICD-10-CM | POA: Insufficient documentation

## 2013-10-11 DIAGNOSIS — Z800 Family history of malignant neoplasm of digestive organs: Secondary | ICD-10-CM | POA: Insufficient documentation

## 2014-06-30 DIAGNOSIS — N870 Mild cervical dysplasia: Secondary | ICD-10-CM

## 2014-06-30 HISTORY — DX: Mild cervical dysplasia: N87.0

## 2015-04-16 NOTE — L&D Delivery Note (Signed)
Delivery Note At 10:55 PM a viable female was delivered via Vaginal, Spontaneous Delivery (Presentation:Vtx, nuchal hand;  ).  APGAR: 9, 9; weight  .   Placenta status: Spontaneous, intact .  Cord: 3VI with the following complications:none .  Cord pH: NA  Anesthesia:  Local Episiotomy: None Lacerations: 1st degree Suture Repair: 3.0 vicryl rapide Est. Blood Loss (mL): 350  Mom to postpartum.  Baby to Couplet care / Skin to Skin.  Daphine DeutscherMartin A Tienna Bienkowski 02/24/2016, 11:18 PM

## 2015-04-25 ENCOUNTER — Encounter (HOSPITAL_COMMUNITY): Payer: Self-pay

## 2015-04-25 ENCOUNTER — Emergency Department (HOSPITAL_COMMUNITY): Payer: Medicaid Other

## 2015-04-25 ENCOUNTER — Emergency Department (HOSPITAL_COMMUNITY)
Admission: EM | Admit: 2015-04-25 | Discharge: 2015-04-25 | Disposition: A | Discharge status: Home / Self Care | Payer: Medicaid Other | Attending: Emergency Medicine | Admitting: Emergency Medicine

## 2015-04-25 DIAGNOSIS — J069 Acute upper respiratory infection, unspecified: Secondary | ICD-10-CM | POA: Insufficient documentation

## 2015-04-25 DIAGNOSIS — R071 Chest pain on breathing: Secondary | ICD-10-CM | POA: Diagnosis not present

## 2015-04-25 DIAGNOSIS — R0789 Other chest pain: Secondary | ICD-10-CM

## 2015-04-25 DIAGNOSIS — R110 Nausea: Secondary | ICD-10-CM | POA: Insufficient documentation

## 2015-04-25 DIAGNOSIS — F410 Panic disorder [episodic paroxysmal anxiety] without agoraphobia: Secondary | ICD-10-CM | POA: Insufficient documentation

## 2015-04-25 DIAGNOSIS — D649 Anemia, unspecified: Secondary | ICD-10-CM | POA: Diagnosis not present

## 2015-04-25 DIAGNOSIS — F329 Major depressive disorder, single episode, unspecified: Secondary | ICD-10-CM | POA: Insufficient documentation

## 2015-04-25 DIAGNOSIS — K219 Gastro-esophageal reflux disease without esophagitis: Secondary | ICD-10-CM | POA: Diagnosis not present

## 2015-04-25 DIAGNOSIS — Z792 Long term (current) use of antibiotics: Secondary | ICD-10-CM | POA: Diagnosis not present

## 2015-04-25 DIAGNOSIS — Z7951 Long term (current) use of inhaled steroids: Secondary | ICD-10-CM | POA: Insufficient documentation

## 2015-04-25 DIAGNOSIS — Z79899 Other long term (current) drug therapy: Secondary | ICD-10-CM | POA: Insufficient documentation

## 2015-04-25 DIAGNOSIS — Z8742 Personal history of other diseases of the female genital tract: Secondary | ICD-10-CM | POA: Insufficient documentation

## 2015-04-25 DIAGNOSIS — R079 Chest pain, unspecified: Secondary | ICD-10-CM | POA: Diagnosis present

## 2015-04-25 DIAGNOSIS — B9789 Other viral agents as the cause of diseases classified elsewhere: Secondary | ICD-10-CM

## 2015-04-25 DIAGNOSIS — J45909 Unspecified asthma, uncomplicated: Secondary | ICD-10-CM | POA: Insufficient documentation

## 2015-04-25 LAB — CBC
HEMATOCRIT: 35.9 % — AB (ref 36.0–46.0)
HEMOGLOBIN: 11.7 g/dL — AB (ref 12.0–15.0)
MCH: 27.9 pg (ref 26.0–34.0)
MCHC: 32.6 g/dL (ref 30.0–36.0)
MCV: 85.5 fL (ref 78.0–100.0)
Platelets: 278 10*3/uL (ref 150–400)
RBC: 4.2 MIL/uL (ref 3.87–5.11)
RDW: 13 % (ref 11.5–15.5)
WBC: 7.6 10*3/uL (ref 4.0–10.5)

## 2015-04-25 LAB — I-STAT TROPONIN, ED: TROPONIN I, POC: 0 ng/mL (ref 0.00–0.08)

## 2015-04-25 LAB — BASIC METABOLIC PANEL
ANION GAP: 8 (ref 5–15)
BUN: 15 mg/dL (ref 6–20)
CALCIUM: 9.1 mg/dL (ref 8.9–10.3)
CO2: 27 mmol/L (ref 22–32)
Chloride: 105 mmol/L (ref 101–111)
Creatinine, Ser: 0.74 mg/dL (ref 0.44–1.00)
GFR calc Af Amer: >60 mL/min (ref >60)
GFR calc non Af Amer: >60 mL/min (ref >60)
GLUCOSE: 87 mg/dL (ref 65–99)
Potassium: 4 mmol/L (ref 3.5–5.1)
Sodium: 140 mmol/L (ref 135–145)

## 2015-04-25 MED ORDER — PREDNISONE 20 MG PO TABS
60.0000 mg | ORAL_TABLET | Freq: Once | ORAL | Status: AC
  Administered 2015-04-25: 60 mg via ORAL
  Filled 2015-04-25: qty 3

## 2015-04-25 MED ORDER — ONDANSETRON 4 MG PO TBDP
4.0000 mg | ORAL_TABLET | Freq: Once | ORAL | Status: AC
  Administered 2015-04-25: 4 mg via ORAL
  Filled 2015-04-25: qty 1

## 2015-04-25 MED ORDER — ALBUTEROL SULFATE HFA 108 (90 BASE) MCG/ACT IN AERS
2.0000 | INHALATION_SPRAY | Freq: Once | RESPIRATORY_TRACT | Status: AC
  Administered 2015-04-25: 2 via RESPIRATORY_TRACT
  Filled 2015-04-25: qty 6.7

## 2015-04-25 MED ORDER — AEROCHAMBER PLUS FLO-VU LARGE MISC
1.0000 | Freq: Once | Status: AC
  Administered 2015-04-25: 1
  Filled 2015-04-25: qty 1

## 2015-04-25 MED ORDER — ACETAMINOPHEN 500 MG PO TABS: 500.0000 mg | ORAL_TABLET | Freq: Four times a day (QID) | ORAL | Status: DC | PRN

## 2015-04-25 MED ORDER — AEROCHAMBER PLUS W/MASK MISC
Status: AC
  Filled 2015-04-25: qty 1

## 2015-04-25 MED ORDER — OXYCODONE-ACETAMINOPHEN 5-325 MG PO TABS
1.0000 | ORAL_TABLET | Freq: Once | ORAL | Status: AC
  Administered 2015-04-25: 1 via ORAL
  Filled 2015-04-25: qty 1

## 2015-04-25 MED ORDER — PREDNISONE 20 MG PO TABS: 40.0000 mg | ORAL_TABLET | Freq: Every day | ORAL | Status: DC

## 2015-04-25 MED ORDER — TRAMADOL HCL 50 MG PO TABS
50.0000 mg | ORAL_TABLET | Freq: Four times a day (QID) | ORAL | Status: DC | PRN
Start: 2015-04-25 — End: 2015-06-22

## 2015-04-25 MED ORDER — BENZONATATE 100 MG PO CAPS: 100.0000 mg | ORAL_CAPSULE | Freq: Three times a day (TID) | ORAL | Status: DC | PRN

## 2015-04-25 NOTE — Discharge Instructions (Signed)
Take prednisone as prescribed. Use albuterol inhaler, 2 puffs every 4-6 hours, as needed for chest discomfort or shortness of breath. Take Tylenol as prescribed for pain and Tramadol as needed for severe pain. You have been prescribed Tessalon for cough. Finish the medications prescribed by your primary care doctor and follow-up with your primary care doctor in 1 week for a recheck of symptoms.  Costochondritis Costochondritis, sometimes called Tietze syndrome, is a swelling and irritation (inflammation) of the tissue (cartilage) that connects your ribs with your breastbone (sternum). It causes pain in the chest and rib area. Costochondritis usually goes away on its own over time. It can take up to 6 weeks or longer to get better, especially if you are unable to limit your activities. CAUSES  Some cases of costochondritis have no known cause. Possible causes include:  Injury (trauma).  Exercise or activity such as lifting.  Severe coughing. SIGNS AND SYMPTOMS  Pain and tenderness in the chest and rib area.  Pain that gets worse when coughing or taking deep breaths.  Pain that gets worse with specific movements. DIAGNOSIS  Your health care provider will do a physical exam and ask about your symptoms. Chest X-rays or other tests may be done to rule out other problems. TREATMENT  Costochondritis usually goes away on its own over time. Your health care provider may prescribe medicine to help relieve pain. HOME CARE INSTRUCTIONS   Avoid exhausting physical activity. Try not to strain your ribs during normal activity. This would include any activities using chest, abdominal, and side muscles, especially if heavy weights are used.  Apply ice to the affected area for the first 2 days after the pain begins.  Put ice in a plastic bag.  Place a towel between your skin and the bag.  Leave the ice on for 20 minutes, 2-3 times a day.  Only take over-the-counter or prescription medicines as  directed by your health care provider. SEEK MEDICAL CARE IF:  You have redness or swelling at the rib joints. These are signs of infection.  Your pain does not go away despite rest or medicine. SEEK IMMEDIATE MEDICAL CARE IF:   Your pain increases or you are very uncomfortable.  You have shortness of breath or difficulty breathing.  You cough up blood.  You have worse chest pains, sweating, or vomiting.  You have a fever or persistent symptoms for more than 2-3 days.  You have a fever and your symptoms suddenly get worse. MAKE SURE YOU:   Understand these instructions.  Will watch your condition.  Will get help right away if you are not doing well or get worse.   This information is not intended to replace advice given to you by your health care provider. Make sure you discuss any questions you have with your health care provider.   Document Released: 01/09/2005 Document Revised: 01/20/2013 Document Reviewed: 11/03/2012 Elsevier Interactive Patient Education 2016 [employer]  Upper Respiratory Infection, Adult Most upper respiratory infections (URIs) are a viral infection of the air passages leading to the lungs. A URI affects the nose, throat, and upper air passages. The most common type of URI is nasopharyngitis and is typically referred to as "the common cold." URIs run their course and usually go away on their own. Most of the time, a URI does not require medical attention, but sometimes a bacterial infection in the upper airways can follow a viral infection. This is called a secondary infection. Sinus and middle ear infections are common  types of secondary upper respiratory infections. Bacterial pneumonia can also complicate a URI. A URI can worsen asthma and chronic obstructive pulmonary disease (COPD). Sometimes, these complications can require emergency medical care and may be life threatening.  CAUSES Almost all URIs are caused by viruses. A virus is a type of germ  and can spread from one person to another.  RISKS FACTORS You may be at risk for a URI if:   You smoke.   You have chronic heart or lung disease.  You have a weakened defense (immune) system.   You are very young or very old.   You have nasal allergies or asthma.  You work in crowded or poorly ventilated areas.  You work in health care facilities or schools. SIGNS AND SYMPTOMS  Symptoms typically develop 2-3 days after you come in contact with a cold virus. Most viral URIs last 7-10 days. However, viral URIs from the influenza virus (flu virus) can last 14-18 days and are typically more severe. Symptoms may include:   Runny or stuffy (congested) nose.   Sneezing.   Cough.   Sore throat.   Headache.   Fatigue.   Fever.   Loss of appetite.   Pain in your forehead, behind your eyes, and over your cheekbones (sinus pain).  Muscle aches.  DIAGNOSIS  Your health care provider may diagnose a URI by:  Physical exam.  Tests to check that your symptoms are not due to another condition such as:  Strep throat.  Sinusitis.  Pneumonia.  Asthma. TREATMENT  A URI goes away on its own with time. It cannot be cured with medicines, but medicines may be prescribed or recommended to relieve symptoms. Medicines may help:  Reduce your fever.  Reduce your cough.  Relieve nasal congestion. HOME CARE INSTRUCTIONS   Take medicines only as directed by your health care provider.   Gargle warm saltwater or take cough drops to comfort your throat as directed by your health care provider.  Use a warm mist humidifier or inhale steam from a shower to increase air moisture. This may make it easier to breathe.  Drink enough fluid to keep your urine clear or pale yellow.   Eat soups and other clear broths and maintain good nutrition.   Rest as needed.   Return to work when your temperature has returned to normal or as your health care provider advises. You may  need to stay home longer to avoid infecting others. You can also use a face mask and careful hand washing to prevent spread of the virus.  Increase the usage of your inhaler if you have asthma.   Do not use any tobacco products, including cigarettes, chewing tobacco, or electronic cigarettes. If you need help quitting, ask your health care provider. PREVENTION  The best way to protect yourself from getting a cold is to practice good hygiene.   Avoid oral or hand contact with people with cold symptoms.   Wash your hands often if contact occurs.  There is no clear evidence that vitamin C, vitamin E, echinacea, or exercise reduces the chance of developing a cold. However, it is always recommended to get plenty of rest, exercise, and practice good nutrition.  SEEK MEDICAL CARE IF:   You are getting worse rather than better.   Your symptoms are not controlled by medicine.   You have chills.  You have worsening shortness of breath.  You have brown or red mucus.  You have yellow or brown nasal discharge.  You have pain in your face, especially when you bend forward.  You have a fever.  You have swollen neck glands.  You have pain while swallowing.  You have white areas in the back of your throat. SEEK IMMEDIATE MEDICAL CARE IF:   You have severe or persistent:  Headache.  Ear pain.  Sinus pain.  Chest pain.  You have chronic lung disease and any of the following:  Wheezing.  Prolonged cough.  Coughing up blood.  A change in your usual mucus.  You have a stiff neck.  You have changes in your:  Vision.  Hearing.  Thinking.  Mood. MAKE SURE YOU:   Understand these instructions.  Will watch your condition.  Will get help right away if you are not doing well or get worse.   This information is not intended to replace advice given to you by your health care provider. Make sure you discuss any questions you have with your health care provider.     Document Released: 09/25/2000 Document Revised: 08/16/2014 Document Reviewed: 07/07/2013 Elsevier Interactive Patient Education [employer]

## 2015-04-25 NOTE — ED Notes (Signed)
Pt here with c/o central chest pain radiating to back "feels like someone is sitting on my chest" and she also reports congestion/cough of yellow mucus.

## 2015-04-25 NOTE — ED Provider Notes (Signed)
CSN: [id number]     Arrival date & time 04/25/15  2027 History   First MD Initiated Contact with Patient 04/25/15 2211     Chief Complaint  Patient presents with  . Chest Pain     (Consider location/radiation/quality/duration/timing/severity/associated sxs/prior Treatment) HPI Comments: Patient is a 25 year old female with a history of asthma, esophageal reflux, depression, and pain disorder. She presents to the ED for evaluation of central chest pain. Patient was diagnosed with an upper respiratory infection characterized by nasal congestion and cough. She was prescribed Zyrtec and azithromycin by her primary care doctor which she has been taking. She reports developing central pressure-like chest pain which radiates to her back. She reports that it feels as though someone is sitting on her chest. She denies taking any medications for her symptoms. They have been constant since onset and aggravated with certain movements. She reports a cough productive of yellow mucus. No leg swelling, syncope or near-syncope, fever, recent surgeries or hospitalizations, or family history of sudden cardiac death. Patient denies the use of birth control.  Patient is a 25 y.o. female presenting with chest pain. The history is provided by the patient. No language interpreter was used.  Chest Pain Associated symptoms: cough and nausea   Associated symptoms: no fever and not vomiting     Past Medical History  Diagnosis Date  . Asthma   . Drug abuse, nondependent   . GERD (gastroesophageal reflux disease)   . Urinary incontinence   . Lumbago   . Anemia   . Amenorrhea   . Polypharmacy   . Depression   . Pain disorder    Past Surgical History  Procedure Laterality Date  . Hip surgery    . Leg surgery     No family history on file. Social History  Substance Use Topics  . Smoking status: Never Smoker   . Smokeless tobacco: None  . Alcohol Use: No   OB History    No data available      Review of  Systems  Constitutional: Negative for fever.  HENT: Positive for congestion.   Respiratory: Positive for cough and chest tightness.   Cardiovascular: Positive for chest pain. Negative for leg swelling.  Gastrointestinal: Positive for nausea. Negative for vomiting.  Neurological: Negative for syncope and light-headedness.  All other systems reviewed and are negative.   Allergies  Ibuprofen; Keflex; and Lactose intolerance (gi)  Home Medications   Prior to Admission medications   Medication Sig Start Date End Date Taking? Authorizing Provider  azithromycin (ZITHROMAX) 250 MG tablet Take 250 mg by mouth daily.   Yes Historical Provider, MD  Olopatadine HCl (PAZEO OP) Place 1 tablet into both eyes daily as needed. For dry eyes   Yes Historical Provider, MD  acetaminophen (TYLENOL) 500 MG tablet Take 1,000 mg by mouth every 6 (six) hours as needed. For pain     Historical Provider, MD  albuterol (PROVENTIL HFA;VENTOLIN HFA) 108 (90 BASE) MCG/ACT inhaler Inhale 2 puffs into the lungs every 6 (six) hours as needed. For wheezing    Historical Provider, MD  APAP-Parabrom-Pyrilamine (PAMPRIN MULTI-SYMPTOM) 500-25-15 MG TABS Take 1 tablet by mouth every 6 (six) hours as needed. For pain during menstrual cycle     Historical Provider, MD  clonazePAM (KLONOPIN) 1 MG tablet Take 1 mg by mouth 3 (three) times daily.      Historical Provider, MD  fluticasone (FLONASE) 50 MCG/ACT nasal spray Place 2 sprays into the nose daily.  Historical Provider, MD  gabapentin (NEURONTIN) 300 MG capsule Take 300 mg by mouth at bedtime.      Historical Provider, MD  gabapentin (NEURONTIN) 400 MG capsule Take 400 mg by mouth 3 (three) times daily.      Historical Provider, MD  hydrOXYzine (ATARAX/VISTARIL) 25 MG tablet Take 25 mg by mouth 3 (three) times daily.      Historical Provider, MD  lidocaine (XYLOCAINE) 2 % solution Take 5-10 mLs by mouth at bedtime as needed. Gargle with 1-2 tsp at night if needed for sore  throat     Historical Provider, MD  loratadine-pseudoephedrine (CLARITIN-D 24-HOUR) 10-240 MG per 24 hr tablet Take 1 tablet by mouth daily.    Historical Provider, MD  methocarbamol (ROBAXIN) 500 MG tablet Take 500 mg by mouth 3 (three) times daily.      Historical Provider, MD  montelukast (SINGULAIR) 10 MG tablet Take 10 mg by mouth daily.      Historical Provider, MD  omeprazole (PRILOSEC) 20 MG capsule Take 20 mg by mouth 2 (two) times daily.     Historical Provider, MD  ondansetron (ZOFRAN-ODT) 4 MG disintegrating tablet Take 4 mg by mouth 2 (two) times daily.      Historical Provider, MD  oxybutynin (DITROPAN) 5 MG tablet Take 5 mg by mouth 2 (two) times daily.      Historical Provider, MD  pregabalin (LYRICA) 150 MG capsule Take 150 mg by mouth 2 (two) times daily.      Historical Provider, MD  Prenatal Vit-Fe Fumarate-FA (PRENATAL MULTIVITAMIN) TABS Take 1 tablet by mouth daily.    Historical Provider, MD  senna-docusate (SENOKOT-S) 8.6-50 MG per tablet Take 1 tablet by mouth 2 (two) times daily.      Historical Provider, MD  SUMAtriptan (IMITREX) 25 MG tablet Take 25 mg by mouth 3 (three) times daily as needed. For headaches    Historical Provider, MD  traZODone (DESYREL) 100 MG tablet Take 200 mg by mouth at bedtime.     Historical Provider, MD   BP 113/73 mmHg  Pulse 91  Temp(Src) 98.1 F (36.7 C) (Oral)  Resp 20  SpO2 100%  LMP 04/25/2015   Physical Exam  Constitutional: She is oriented to person, place, and time. She appears well-developed and well-nourished. No distress.  Nontoxic/nonseptic appearing  HENT:  Head: Normocephalic and atraumatic.  Mouth/Throat: Oropharynx is clear and moist. No oropharyngeal exudate.  Eyes: Conjunctivae and EOM are normal. No scleral icterus.  Neck: Normal range of motion.  Cardiovascular: Normal rate, regular rhythm and intact distal pulses.   Pulmonary/Chest: Effort normal and breath sounds normal. No respiratory distress. She has no  wheezes. She has no rales.  Lungs clear bilaterally. No tachypnea or dyspnea. Chest expansion symmetric. No bony deformity or crepitus to chest wall.  Abdominal: Soft. She exhibits no distension. There is no tenderness. There is no rebound.  Soft, nontender abdomen  Musculoskeletal: Normal range of motion.  Neurological: She is alert and oriented to person, place, and time. She exhibits normal muscle tone. Coordination normal.  Patient moving all extremities  Skin: Skin is warm and dry. No rash noted. She is not diaphoretic. No erythema. No pallor.  Psychiatric: She has a normal mood and affect. Her behavior is normal.  Nursing note and vitals reviewed.   ED Course  Procedures (including critical care time) Labs Review Labs Reviewed  CBC - Abnormal; Notable for the following:    Hemoglobin 11.7 (*)    HCT 35.9 (*)  All other components within normal limits  BASIC METABOLIC PANEL  I-STAT TROPOININ, ED    Imaging Review Dg Chest 2 View  04/25/2015  CLINICAL DATA:  25 year old female with chest pain EXAM: CHEST  2 VIEW COMPARISON:  Radiograph dated 04/09/2011 FINDINGS: The heart size and mediastinal contours are within normal limits. Both lungs are clear. The visualized skeletal structures are unremarkable. IMPRESSION: No active cardiopulmonary disease. Electronically Signed   By: Elgie Collard M.D.   On: 04/25/2015 21:18   I have personally reviewed and evaluated these images and lab results as part of my medical decision-making.  ED ECG REPORT   Date: 04/25/2015  Rate: 92  Rhythm: normal sinus rhythm and sinus arrhythmia  QRS Axis: normal  Intervals: normal  ST/T Wave abnormalities: normal  Conduction Disutrbances:none  Narrative Interpretation: Sinus arrhythmia; no STEMI or ischemic change.  Old EKG Reviewed: none available  I have personally reviewed the EKG tracing and agree with the computerized printout as noted.   MDM   Final diagnoses:  Costochondral chest  pain  Viral URI with cough    Pt CXR negative for acute infiltrate. Patients symptoms are consistent with URI, likely viral etiology, with secondary costochondritis. Doubt ACS; Heart score is 0. Patient is also PERC negative. Doubt PE. No indication for further emergent work up. Plan to discharge with symptomatic treatment.  Patient verbalizes understanding and is agreeable with plan. Patient is hemodynamically stable and in NAD prior to discharge.   Filed Vitals:   04/25/15 2033 04/25/15 2232  BP: 113/73 108/69  Pulse: 91 81  Temp: 98.1 F (36.7 C)   TempSrc: Oral   Resp: 20 12  SpO2: 100% 100%     Antony Madura, PA-C 04/25/15 2242  Loren Racer, MD 04/25/15 2312

## 2015-06-22 ENCOUNTER — Ambulatory Visit (INDEPENDENT_AMBULATORY_CARE_PROVIDER_SITE_OTHER): Payer: Medicaid Other | Admitting: Obstetrics and Gynecology

## 2015-06-22 ENCOUNTER — Encounter: Payer: Self-pay | Admitting: Obstetrics and Gynecology

## 2015-06-22 ENCOUNTER — Ambulatory Visit: Payer: Self-pay | Admitting: Obstetrics and Gynecology

## 2015-06-22 VITALS — BP 108/63 | HR 112 | Ht 61.0 in | Wt 164.5 lb

## 2015-06-22 DIAGNOSIS — Z86711 Personal history of pulmonary embolism: Secondary | ICD-10-CM | POA: Diagnosis not present

## 2015-06-22 DIAGNOSIS — O09293 Supervision of pregnancy with other poor reproductive or obstetric history, third trimester: Secondary | ICD-10-CM

## 2015-06-22 DIAGNOSIS — N912 Amenorrhea, unspecified: Secondary | ICD-10-CM | POA: Diagnosis not present

## 2015-06-22 DIAGNOSIS — Z331 Pregnant state, incidental: Secondary | ICD-10-CM

## 2015-06-22 DIAGNOSIS — Z3490 Encounter for supervision of normal pregnancy, unspecified, unspecified trimester: Secondary | ICD-10-CM

## 2015-06-22 LAB — POCT URINE PREGNANCY: Preg Test, Ur: POSITIVE — AB
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
## 2015-06-23 ENCOUNTER — Encounter: Payer: Self-pay | Admitting: Obstetrics and Gynecology

## 2015-06-23 DIAGNOSIS — Z3490 Encounter for supervision of normal pregnancy, unspecified, unspecified trimester: Secondary | ICD-10-CM | POA: Insufficient documentation

## 2015-06-23 DIAGNOSIS — Z86711 Personal history of pulmonary embolism: Secondary | ICD-10-CM | POA: Insufficient documentation

## 2015-06-23 DIAGNOSIS — O09291 Supervision of pregnancy with other poor reproductive or obstetric history, first trimester: Secondary | ICD-10-CM | POA: Insufficient documentation

## 2015-06-23 NOTE — Patient Instructions (Addendum)
1. Prenatal vitamin daily 2. Return in 5 weeks for a new OB nursing appointment 3. Return in 7 weeks for new OB physical exam

## 2015-07-13 ENCOUNTER — Telehealth: Payer: Self-pay | Admitting: Obstetrics and Gynecology

## 2015-07-13 NOTE — Telephone Encounter (Signed)
Patient called complaining of nausea. She would like something sent to her pharmacy if possible. [place] [facility] [place]Thanks

## 2015-07-13 NOTE — Telephone Encounter (Signed)
Called pt she states that she is having a lot of nausea, has only vomited 1-2 times. Advised pt to use vitamin b6 25mg  TID with Unisom at night. Pt gave verbal understanding. To call back if no improvement.

## 2015-07-27 ENCOUNTER — Ambulatory Visit (INDEPENDENT_AMBULATORY_CARE_PROVIDER_SITE_OTHER): Payer: Medicaid Other | Admitting: Obstetrics and Gynecology

## 2015-07-27 VITALS — BP 104/73 | HR 78 | Wt 164.1 lb

## 2015-07-27 DIAGNOSIS — R638 Other symptoms and signs concerning food and fluid intake: Secondary | ICD-10-CM

## 2015-07-27 DIAGNOSIS — Z1389 Encounter for screening for other disorder: Secondary | ICD-10-CM

## 2015-07-27 DIAGNOSIS — Z331 Pregnant state, incidental: Secondary | ICD-10-CM

## 2015-07-27 DIAGNOSIS — Z113 Encounter for screening for infections with a predominantly sexual mode of transmission: Secondary | ICD-10-CM

## 2015-07-27 DIAGNOSIS — Z3490 Encounter for supervision of normal pregnancy, unspecified, unspecified trimester: Secondary | ICD-10-CM

## 2015-07-27 DIAGNOSIS — Z369 Encounter for antenatal screening, unspecified: Secondary | ICD-10-CM

## 2015-07-27 DIAGNOSIS — Z36 Encounter for antenatal screening of mother: Secondary | ICD-10-CM

## 2015-07-27 LAB — OB RESULTS CONSOLE VARICELLA ZOSTER ANTIBODY, IGG: VARICELLA IGG: NON-IMMUNE/NOT IMMUNE

## 2015-07-27 NOTE — Patient Instructions (Signed)
Pregnancy and Zika Virus Disease Zika virus disease, or Zika, is an illness that can spread to people from mosquitoes that carry the virus. It may also spread from person to person through infected body fluids. Zika first occurred in [place], but recently it has spread to new areas. The virus occurs in tropical climates. The location of Zika continues to change. Most people who become infected with Zika virus do not develop serious illness. However, Zika may cause birth defects in an unborn baby whose mother is infected with the virus. It may also increase the risk of miscarriage. WHAT ARE THE SYMPTOMS OF ZIKA VIRUS DISEASE? In many cases, people who have been infected with Zika virus do not develop any symptoms. If symptoms appear, they usually start about a week after the person is infected. Symptoms are usually mild. They may include:  Fever.  Rash.  Red eyes.  Joint pain. HOW DOES ZIKA VIRUS DISEASE SPREAD? The main way that Zika virus spreads is through the bite of a certain type of mosquito. Unlike most types of mosquitos, which bite only at night, the type of mosquito that carries Zika virus bites both at night and during the day. Zika virus can also spread through sexual contact, through a blood transfusion, and from a mother to her baby before or during birth. Once you have had Zika virus disease, it is unlikely that you will get it again. CAN I PASS ZIKA TO MY BABY DURING PREGNANCY? Yes, Zika can pass from a mother to her baby before or during birth. WHAT PROBLEMS CAN ZIKA CAUSE FOR MY BABY? A woman who is infected with Zika virus while pregnant is at risk of having her baby born with a condition in which the brain or head is smaller than expected (microcephaly). Babies who have microcephaly can have developmental delays, seizures, hearing problems, and vision problems. Having Zika virus disease during pregnancy can also increase the risk of miscarriage. HOW CAN ZIKA VIRUS DISEASE BE  PREVENTED? There is no vaccine to prevent Zika. The best way to prevent the disease is to avoid infected mosquitoes and avoid exposure to body fluids that can spread the virus. Avoid any possible exposure to Zika by taking the following precautions. For women and their sex partners:  Avoid traveling to high-risk areas. The locations where Zika is being reported change often. To identify high-risk areas, check the CDC travel website: www.cdc.gov/zika/geo/index.html  If you or your sex partner must travel to a high-risk area, talk with a health care provider before and after traveling.  Take all precautions to avoid mosquito bites if you live in, or travel to, any of the high-risk areas. Insect repellents are safe to use during pregnancy.  Ask your health care provider when it is safe to have sexual contact. For women:  If you are pregnant or trying to become pregnant, avoid sexual contact with persons who may have been exposed to Zika virus, persons who have possible symptoms of Zika, or persons whose history you are unsure about. If you choose to have sexual contact with someone who may have been exposed to Zika virus, use condoms correctly during the entire duration of sexual activity, every time. Do not share sexual devices, as you may be exposed to body fluids.  Ask your health care provider about when it is safe to attempt pregnancy after a possible exposure to Zika virus. WHAT STEPS SHOULD I TAKE TO AVOID MOSQUITO BITES? Take these steps to avoid mosquito bites when you are   in a high-risk area:  Wear loose clothing that covers your arms and legs.  Limit your outdoor activities.  Do not open windows unless they have window screens.  Sleep under mosquito nets.  Use insect repellent. The best insect repellents have:  DEET, picaridin, oil of lemon eucalyptus (OLE), or IR3535 in them.  Higher amounts of an active ingredient in them.  Remember that insect repellents are safe to use  during pregnancy.  Do not use OLE on children who are younger than 3 years of age. Do not use insect repellent on babies who are younger than 2 months of age.  Cover your child's stroller with mosquito netting. Make sure the netting fits snugly and that any loose netting does not cover your child's mouth or nose. Do not use a blanket as a mosquito-protection cover.  Do not apply insect repellent underneath clothing.  If you are using sunscreen, apply the sunscreen before applying the insect repellent.  Treat clothing with permethrin. Do not apply permethrin directly to your skin. Follow label directions for safe use.  Get rid of standing water, where mosquitoes may reproduce. Standing water is often found in items such as buckets, bowls, animal food dishes, and flowerpots. When you return from traveling to any high-risk area, continue taking actions to protect yourself against mosquito bites for 3 weeks, even if you show no signs of illness. This will prevent spreading Zika virus to uninfected mosquitoes. WHAT SHOULD I KNOW ABOUT THE SEXUAL TRANSMISSION OF ZIKA? People can spread Zika to their sexual partners during vaginal, anal, or oral sex, or by sharing sexual devices. Many people with Zika do not develop symptoms, so a person could spread the disease without knowing that they are infected. The greatest risk is to women who are pregnant or who may become pregnant. Zika virus can live longer in semen than it can live in blood. Couples can prevent sexual transmission of the virus by:  Using condoms correctly during the entire duration of sexual activity, every time. This includes vaginal, anal, and oral sex.  Not sharing sexual devices. Sharing increases your risk of being exposed to body fluid from another person.  Avoiding all sexual activity until your health care provider says it is safe. SHOULD I BE TESTED FOR ZIKA VIRUS? A sample of your blood can be tested for Zika virus. A pregnant  woman should be tested if she may have been exposed to the virus or if she has symptoms of Zika. She may also have additional tests done during her pregnancy, such ultrasound testing. Talk with your health care provider about which tests are recommended.   This information is not intended to replace advice given to you by your health care provider. Make sure you discuss any questions you have with your health care provider.   Document Released: 12/21/2014 Document Reviewed: 12/14/2014 Elsevier Interactive Patient Education 2016 [employer] Minor Illnesses and Medications in Pregnancy  Cold/Flu:  Sudafed for congestion- Robitussin (plain) for cough- Tylenol for discomfort.  Please follow the directions on the label.  Try not to take any more than needed.  OTC Saline nasal spray and air humidifier or cool-mist  Vaporizer to sooth nasal irritation and to loosen congestion.  It is also important to increase intake of non carbonated fluids, especially if you have a fever.  Constipation:  Colace-2 capsules at bedtime; Metamucil- follow directions on label; Senokot- 1 tablet at bedtime.  Any one of these medications can be used.  It is also   very important to increase fluids and fruits along with regular exercise.  If problem persists please call the office.  Diarrhea:  Kaopectate as directed on the label.  Eat a bland diet and increase fluids.  Avoid highly seasoned foods.  Headache:  Tylenol 1 or 2 tablets every 3-4 hours as needed  Indigestion:  Maalox, Mylanta, Tums or Rolaids- as directed on label.  Also try to eat small meals and avoid fatty, greasy or spicy foods.  Nausea with or without Vomiting:  Nausea in pregnancy is caused by increased levels of hormones in the body which influence the digestive system and cause irritation when stomach acids accumulate.  Symptoms usually subside after 1st trimester of pregnancy.  Try the following:  Keep saltines, graham crackers or dry toast by your bed  to eat upon awakening.  Don't let your stomach get empty.  Try to eat 5-6 small meals per day instead of 3 large ones.  Avoid greasy fatty or highly seasoned foods.   Take OTC Unisom 1 tablet at bed time along with OTC Vitamin B6 25-50 mg 3 times per day.    If nausea continues with vomiting and you are unable to keep down food and fluids you may need a prescription medication.  Please notify your provider.   Sore throat:  Chloraseptic spray, throat lozenges and or plain Tylenol.  Vaginal Yeast Infection:  OTC Monistat for 7 days as directed on label.  If symptoms do not resolve within a week notify provider.  If any of the above problems do not subside with recommended treatment please call the office for further assistance.   Do not take Aspirin, Advil, Motrin or Ibuprofen.  * * OTC= Over the counter Hyperemesis Gravidarum Hyperemesis gravidarum is a severe form of nausea and vomiting that happens during pregnancy. Hyperemesis is worse than morning sickness. It may cause you to have nausea or vomiting all day for many days. It may keep you from eating and drinking enough food and liquids. Hyperemesis usually occurs during the first half (the first 20 weeks) of pregnancy. It often goes away once a woman is in her second half of pregnancy. However, sometimes hyperemesis continues through an entire pregnancy.  CAUSES  The cause of this condition is not completely known but is thought to be related to changes in the body's hormones when pregnant. It could be from the high level of the pregnancy hormone or an increase in estrogen in the body.  SIGNS AND SYMPTOMS   Severe nausea and vomiting.  Nausea that does not go away.  Vomiting that does not allow you to keep any food down.  Weight loss and body fluid loss (dehydration).  Having no desire to eat or not liking food you have previously enjoyed. DIAGNOSIS  Your health care provider will do a physical exam and ask you about your symptoms.  He or she may also order blood tests and urine tests to make sure something else is not causing the problem.  TREATMENT  You may only need medicine to control the problem. If medicines do not control the nausea and vomiting, you will be treated in the hospital to prevent dehydration, increased acid in the blood (acidosis), weight loss, and changes in the electrolytes in your body that may harm the unborn baby (fetus). You may need IV fluids.  HOME CARE INSTRUCTIONS   Only take over-the-counter or prescription medicines as directed by your health care provider.  Try eating a couple of dry crackers or   toast in the morning before getting out of bed.  Avoid foods and smells that upset your stomach.  Avoid fatty and spicy foods.  Eat 5-6 small meals a day.  Do not drink when eating meals. Drink between meals.  For snacks, eat high-protein foods, such as cheese.  Eat or suck on things that have ginger in them. Ginger helps nausea.  Avoid food preparation. The smell of food can spoil your appetite.  Avoid iron pills and iron in your multivitamins until after 3-4 months of being pregnant. However, consult with your health care provider before stopping any prescribed iron pills. SEEK MEDICAL CARE IF:   Your abdominal pain increases.  You have a severe headache.  You have vision problems.  You are losing weight. SEEK IMMEDIATE MEDICAL CARE IF:   You are unable to keep fluids down.  You vomit blood.  You have constant nausea and vomiting.  You have excessive weakness.  You have extreme thirst.  You have dizziness or fainting.  You have a fever or persistent symptoms for more than 2-3 days.  You have a fever and your symptoms suddenly get worse. MAKE SURE YOU:   Understand these instructions.  Will watch your condition.  Will get help right away if you are not doing well or get worse.   This information is not intended to replace advice given to you by your health care  provider. Make sure you discuss any questions you have with your health care provider.   Document Released: 04/01/2005 Document Revised: 01/20/2013 Document Reviewed: 11/11/2012 Elsevier Interactive Patient Education 2016 [employer] Commonly Asked Questions During Pregnancy  Cats: A parasite can be excreted in cat feces.  To avoid exposure you need to have another person empty the little box.  If you must empty the litter box you will need to wear gloves.  Wash your hands after handling your cat.  This parasite can also be found in raw or undercooked meat so this should also be avoided.  Colds, Sore Throats, Flu: Please check your medication sheet to see what you can take for symptoms.  If your symptoms are unrelieved by these medications please call the office.  Dental Work: Most any dental work your dentist recommends is permitted.  X-rays should only be taken during the first trimester if absolutely necessary.  Your abdomen should be shielded with a lead apron during all x-rays.  Please notify your provider prior to receiving any x-rays.  Novocaine is fine; gas is not recommended.  If your dentist requires a note from us prior to dental work please call the office and we will provide one for you.  Exercise: Exercise is an important part of staying healthy during your pregnancy.  You may continue most exercises you were accustomed to prior to pregnancy.  Later in your pregnancy you will most likely notice you have difficulty with activities requiring balance like riding a bicycle.  It is important that you listen to your body and avoid activities that put you at a higher risk of falling.  Adequate rest and staying well hydrated are a must!  If you have questions about the safety of specific activities ask your provider.    Exposure to Children with illness: Try to avoid obvious exposure; report any symptoms to us when noted,  If you have chicken pos, red measles or mumps, you should be immune to  these diseases.   Please do not take any vaccines while pregnant unless you have checked with   your OB provider.  Fetal Movement: After 28 weeks we recommend you do "kick counts" twice daily.  Lie or sit down in a calm quiet environment and count your baby movements "kicks".  You should feel your baby at least 10 times per hour.  If you have not felt 10 kicks within the first hour get up, walk around and have something sweet to eat or drink then repeat for an additional hour.  If count remains less than 10 per hour notify your provider.  Fumigating: Follow your pest control agent's advice as to how long to stay out of your home.  Ventilate the area well before re-entering.  Hemorrhoids:   Most over-the-counter preparations can be used during pregnancy.  Check your medication to see what is safe to use.  It is important to use a stool softener or fiber in your diet and to drink lots of liquids.  If hemorrhoids seem to be getting worse please call the office.   Hot Tubs:  Hot tubs Jacuzzis and saunas are not recommended while pregnant.  These increase your internal body temperature and should be avoided.  Intercourse:  Sexual intercourse is safe during pregnancy as long as you are comfortable, unless otherwise advised by your provider.  Spotting may occur after intercourse; report any bright red bleeding that is heavier than spotting.  Labor:  If you know that you are in labor, please go to the hospital.  If you are unsure, please call the office and let us help you decide what to do.  Lifting, straining, etc:  If your job requires heavy lifting or straining please check with your provider for any limitations.  Generally, you should not lift items heavier than that you can lift simply with your hands and arms (no back muscles)  Painting:  Paint fumes do not harm your pregnancy, but may make you ill and should be avoided if possible.  Latex or water based paints have less odor than oils.  Use adequate  ventilation while painting.  Permanents & Hair Color:  Chemicals in hair dyes are not recommended as they cause increase hair dryness which can increase hair loss during pregnancy.  " Highlighting" and permanents are allowed.  Dye may be absorbed differently and permanents may not hold as well during pregnancy.  Sunbathing:  Use a sunscreen, as skin burns easily during pregnancy.  Drink plenty of fluids; avoid over heating.  Tanning Beds:  Because their possible side effects are still unknown, tanning beds are not recommended.  Ultrasound Scans:  Routine ultrasounds are performed at approximately 20 weeks.  You will be able to see your baby's general anatomy an if you would like to know the gender this can usually be determined as well.  If it is questionable when you conceived you may also receive an ultrasound early in your pregnancy for dating purposes.  Otherwise ultrasound exams are not routinely performed unless there is a medical necessity.  Although you can request a scan we ask that you pay for it when conducted because insurance does not cover " patient request" scans.  Work: If your pregnancy proceeds without complications you may work until your due date, unless your physician or employer advises otherwise.  Round Ligament Pain/Pelvic Discomfort:  Sharp, shooting pains not associated with bleeding are fairly common, usually occurring in the second trimester of pregnancy.  They tend to be worse when standing up or when you remain standing for long periods of time.  These are the result   of pressure of certain pelvic ligaments called "round ligaments".  Rest, Tylenol and heat seem to be the most effective relief.  As the womb and fetus grow, they rise out of the pelvis and the discomfort improves.  Please notify the office if your pain seems different than that described.  It may represent a more serious condition.   

## 2015-07-27 NOTE — Progress Notes (Signed)
Hannah HahnAdriana Norris presents for NOB nurse interview visit. G-3.  P-2002. Pregnancy confirmation by Dr. Greggory KeeneFrancesco on 06/22/2015. EDD: 02/27/16 by LMP: 05/23/2015.  Pregnancy education material explained and given. No cats in the home. NOB labs ordered. (TSH/HbgA1c due to Increased BMI), HIV labs and Drug screen were explained optional and she could opt out of tests but did not decline. Drug screen ordered. PNV encouraged. NT ordered. Pt. To follow up with provider in 3 weeks for NOB physical.  PT c/o white vaginal discharge and itchy that has actually improved but does remain. Pt states she had bacterial infections with her other pregnancies. To try Monistat 7 OTC and if no better by Monday, stop using cream and contact office for an appt. All questions answered.    ZIKA EXPOSURE SCREEN:  The patient has not traveled to a [place] endemic area within the past 6 months, nor has she had unprotected sex with a partner who has travelled to a [place] endemic region within the past 6 months. The patient has been advised to notify us if these factors change any time during this current pregnancy, so adequate testing and monitoring can be initiated.

## 2015-07-28 LAB — NICOTINE SCREEN, URINE: Cotinine Ql Scrn, Ur: NEGATIVE ng/mL

## 2015-07-28 LAB — PAIN MGT SCRN (14 DRUGS), UR
Amphetamine Screen, Ur: NEGATIVE ng/mL
BUPRENORPHINE, URINE: NEGATIVE ng/mL
Barbiturate Screen, Ur: NEGATIVE ng/mL
Benzodiazepine Screen, Urine: NEGATIVE ng/mL
CREATININE(CRT), U: 158 mg/dL (ref 20.0–300.0)
Cannabinoids Ur Ql Scn: NEGATIVE ng/mL
Cocaine(Metab.)Screen, Urine: NEGATIVE ng/mL
FENTANYL, URINE: NEGATIVE pg/mL
METHADONE SCREEN, URINE: NEGATIVE ng/mL
Meperidine Screen, Urine: NEGATIVE ng/mL
OXYCODONE+OXYMORPHONE UR QL SCN: NEGATIVE ng/mL
Opiate Scrn, Ur: NEGATIVE ng/mL
PCP SCRN UR: NEGATIVE ng/mL
PH UR, DRUG SCRN: 6.4 (ref 4.5–8.9)
Propoxyphene, Screen: NEGATIVE ng/mL
Tramadol Ur Ql Scn: NEGATIVE ng/mL

## 2015-07-28 LAB — URINALYSIS, ROUTINE W REFLEX MICROSCOPIC
Bilirubin, UA: NEGATIVE
Glucose, UA: NEGATIVE
Ketones, UA: NEGATIVE
LEUKOCYTES UA: NEGATIVE
NITRITE UA: NEGATIVE
PH UA: 6.5 (ref 5.0–7.5)
Protein, UA: NEGATIVE
RBC, UA: NEGATIVE
Specific Gravity, UA: 1.025 (ref 1.005–1.030)
Urobilinogen, Ur: 0.2 mg/dL (ref 0.2–1.0)

## 2015-07-29 ENCOUNTER — Emergency Department (HOSPITAL_COMMUNITY)
Admission: EM | Admit: 2015-07-29 | Discharge: 2015-07-30 | Disposition: A | Discharge status: Home / Self Care | Payer: Medicaid Other | Attending: Emergency Medicine | Admitting: Emergency Medicine

## 2015-07-29 ENCOUNTER — Encounter (HOSPITAL_COMMUNITY): Payer: Self-pay | Admitting: Emergency Medicine

## 2015-07-29 DIAGNOSIS — O99511 Diseases of the respiratory system complicating pregnancy, first trimester: Secondary | ICD-10-CM | POA: Insufficient documentation

## 2015-07-29 DIAGNOSIS — Z8719 Personal history of other diseases of the digestive system: Secondary | ICD-10-CM | POA: Insufficient documentation

## 2015-07-29 DIAGNOSIS — O9989 Other specified diseases and conditions complicating pregnancy, childbirth and the puerperium: Secondary | ICD-10-CM | POA: Diagnosis present

## 2015-07-29 DIAGNOSIS — Z862 Personal history of diseases of the blood and blood-forming organs and certain disorders involving the immune mechanism: Secondary | ICD-10-CM | POA: Diagnosis not present

## 2015-07-29 DIAGNOSIS — Z3A09 9 weeks gestation of pregnancy: Secondary | ICD-10-CM | POA: Insufficient documentation

## 2015-07-29 DIAGNOSIS — R1011 Right upper quadrant pain: Secondary | ICD-10-CM | POA: Insufficient documentation

## 2015-07-29 DIAGNOSIS — M545 Low back pain: Secondary | ICD-10-CM | POA: Diagnosis not present

## 2015-07-29 DIAGNOSIS — J45909 Unspecified asthma, uncomplicated: Secondary | ICD-10-CM | POA: Diagnosis not present

## 2015-07-29 DIAGNOSIS — Z79899 Other long term (current) drug therapy: Secondary | ICD-10-CM | POA: Diagnosis not present

## 2015-07-29 DIAGNOSIS — Z3490 Encounter for supervision of normal pregnancy, unspecified, unspecified trimester: Secondary | ICD-10-CM

## 2015-07-29 DIAGNOSIS — Z8659 Personal history of other mental and behavioral disorders: Secondary | ICD-10-CM | POA: Diagnosis not present

## 2015-07-29 LAB — URINALYSIS, ROUTINE W REFLEX MICROSCOPIC
Bilirubin Urine: NEGATIVE
GLUCOSE, UA: NEGATIVE mg/dL
Hgb urine dipstick: NEGATIVE
Ketones, ur: NEGATIVE mg/dL
Leukocytes, UA: NEGATIVE
Nitrite: NEGATIVE
PH: 6 (ref 5.0–8.0)
PROTEIN: NEGATIVE mg/dL
SPECIFIC GRAVITY, URINE: 1.035 — AB (ref 1.005–1.030)

## 2015-07-29 LAB — COMPREHENSIVE METABOLIC PANEL
ALBUMIN: 3.8 g/dL (ref 3.5–5.0)
ALT: 17 U/L (ref 14–54)
ANION GAP: 9 (ref 5–15)
AST: 19 U/L (ref 15–41)
Alkaline Phosphatase: 51 U/L (ref 38–126)
BILIRUBIN TOTAL: 0.4 mg/dL (ref 0.3–1.2)
BUN: 15 mg/dL (ref 6–20)
CO2: 21 mmol/L — ABNORMAL LOW (ref 22–32)
Calcium: 9.4 mg/dL (ref 8.9–10.3)
Chloride: 105 mmol/L (ref 101–111)
Creatinine, Ser: 0.54 mg/dL (ref 0.44–1.00)
GLUCOSE: 98 mg/dL (ref 65–99)
POTASSIUM: 3.7 mmol/L (ref 3.5–5.1)
Sodium: 135 mmol/L (ref 135–145)
Total Protein: 6.9 g/dL (ref 6.5–8.1)

## 2015-07-29 LAB — CBC
HEMATOCRIT: 36.7 % (ref 36.0–46.0)
Hemoglobin: 12.7 g/dL (ref 12.0–15.0)
MCH: 28.9 pg (ref 26.0–34.0)
MCHC: 34.6 g/dL (ref 30.0–36.0)
MCV: 83.4 fL (ref 78.0–100.0)
PLATELETS: 294 10*3/uL (ref 150–400)
RBC: 4.4 MIL/uL (ref 3.87–5.11)
RDW: 13.5 % (ref 11.5–15.5)
WBC: 7.2 10*3/uL (ref 4.0–10.5)

## 2015-07-29 LAB — GC/CHLAMYDIA PROBE AMP
Chlamydia trachomatis, NAA: NEGATIVE
Neisseria gonorrhoeae by PCR: NEGATIVE

## 2015-07-29 LAB — LIPASE, BLOOD: Lipase: 39 U/L (ref 11–51)

## 2015-07-29 NOTE — ED Provider Notes (Signed)
CSN: [id number]     Arrival date & time 07/29/15  2219 History   By signing my name below, I, Arlan Organ, attest that this documentation has been prepared under the direction and in the presence of Tomasita Crumble, MD.  Electronically Signed: Arlan Organ, ED Scribe. 07/30/2015. 12:00 AM.   Chief Complaint  Patient presents with  . Abdominal Pain  . Back Pain   HPI  HPI Comments: Hannah Norris currently [redacted] weeks gestation is a 25 y.o. female with a PMHx of GERD who presents to the Emergency Department complaining of constant, ongoing upper abdominal pain onset this evening. Pt states pain did not start after eating. Pt also reports worsening nausea and lower back pain. No aggravating or alleviating factors reported. No OTC medications or home remedies attempted prior to arrival. She denies any fever, chills, vomiting, or diarrhea. No vaginal discharge or vaginal bleeding. No recent ultrasound with OB/GYN.  PCP: Binnie Kand, MD    Past Medical History  Diagnosis Date  . Asthma   . GERD (gastroesophageal reflux disease)   . Urinary incontinence   . Lumbago   . Anemia   . Amenorrhea   . Polypharmacy   . Depression   . Pain disorder   . Drug abuse, nondependent    Past Surgical History  Procedure Laterality Date  . Hip surgery     Family History  Problem Relation Age of Onset  . Diabetes Maternal Grandmother   . Ovarian cancer Neg Hx   . Breast cancer Neg Hx   . Heart disease Neg Hx    Social History  Substance Use Topics  . Smoking status: Never Smoker   . Smokeless tobacco: Never Used  . Alcohol Use: No   OB History    Gravida Para Term Preterm AB TAB SAB Ectopic Multiple Living   Obstetric Comments   First pregnancy complicated by oligohydramnios; status post induction of labor at 39 weeks     Review of Systems  A complete 10 system review of systems was obtained and all systems are negative except as noted in the HPI and PMH.     Allergies  Ibuprofen; Keflex; Lactose intolerance (gi); and Lactose  Home Medications   Prior to Admission medications   Medication Sig Start Date End Date Taking? Authorizing Provider  Prenatal Vit-Fe Fumarate-FA (PRENATAL MULTIVITAMIN) TABS tablet Take 1 tablet by mouth daily at 12 noon.    Historical Provider, MD  pyridOXINE (VITAMIN B-6) 25 MG tablet Take 25 mg by mouth 3 (three) times daily.    Historical Provider, MD   Triage Vitals: BP 114/70 mmHg  Pulse 105  Temp(Src) 98.9 F (37.2 C) (Oral)  Resp 20  Ht  (1.549 m)  Wt 165 lb 7 oz (75.042 kg)  BMI 31.28 kg/m2  SpO2 98%  LMP 05/23/2015 (Approximate)   Physical Exam  Constitutional: She is oriented to person, place, and time. She appears well-developed and well-nourished. No distress.  HENT:  Head: Normocephalic and atraumatic.  Nose: Nose normal.  Mouth/Throat: Oropharynx is clear and moist. No oropharyngeal exudate.  Eyes: Conjunctivae and EOM are normal. Pupils are equal, round, and reactive to light. No scleral icterus.  Neck: Normal range of motion. Neck supple. No JVD present. No tracheal deviation present. No thyromegaly present.  Cardiovascular: Normal rate, regular rhythm and normal heart sounds.  Exam reveals no gallop and no friction rub.  No murmur heard. Pulmonary/Chest: Effort normal and breath sounds normal. No respiratory distress. She has no wheezes. She exhibits no tenderness.  Abdominal: Soft. Bowel sounds are normal. She exhibits no distension and no mass. There is tenderness. There is no rebound and no guarding.  RUQ tenderness noted   Genitourinary:  Gravis uterus   Musculoskeletal: Normal range of motion. She exhibits no edema or tenderness.  Lymphadenopathy:    She has no cervical adenopathy.  Neurological: She is alert and oriented to person, place, and time. No cranial nerve deficit. She exhibits normal muscle tone.  Skin: Skin is warm and dry. No rash noted. No erythema. No pallor.   Nursing note and vitals reviewed.   ED Course  Procedures (including critical care time)  DIAGNOSTIC STUDIES: Oxygen Saturation is 98% on RA, Normal by my interpretation.    COORDINATION OF CARE: 12:03 AM- Will order imaging, blood work, and urinalysis. Discussed treatment plan with pt at bedside and pt agreed to plan.     Labs Review Labs Reviewed  COMPREHENSIVE METABOLIC PANEL - Abnormal; Notable for the following:    CO2 21 (*)    All other components within normal limits  URINALYSIS, ROUTINE W REFLEX MICROSCOPIC (NOT AT [facility]) - Abnormal; Notable for the following:    Specific Gravity, Urine 1.035 (*)    All other components within normal limits  HCG, QUANTITATIVE, PREGNANCY - Abnormal; Notable for the following:    hCG, Beta ChainMahalia Longest, Quant, S [id number] (*)    All other components within normal limits  LIPASE, BLOOD  CBC    Imaging Review [place] Abdomen Complete  07/30/2015  CLINICAL DATA:  Acute onset of right upper quadrant abdominal pain. Nausea and lower back pain. Initial encounter. EXAM: ABDOMEN ULTRASOUND COMPLETE COMPARISON:  Abdominal ultrasound performed 07/11/2009 FINDINGS: Gallbladder: No gallstones or wall thickening visualized. No sonographic Murphy sign noted by sonographer. Common bile duct: Diameter: 0.4 cm, within normal limits in caliber. Liver: Two small mildly hyperechoic foci are noted within the right hepatic lobe, measuring 1.7 cm and 1.5 cm, likely reflecting hemangiomata. The liver is otherwise grossly unremarkable. Hepatic echogenicity is within normal limits. IVC: No abnormality visualized. Pancreas: Visualized portion unremarkable. Spleen: Size and appearance within normal limits. Right Kidney: Length: 11.0 cm. Echogenicity within normal limits. No mass or hydronephrosis visualized. Left Kidney: Length: 11.3 cm. Echogenicity within normal limits. No mass or hydronephrosis visualized. Abdominal aorta: No aneurysm visualized. Other findings: None. IMPRESSION: 1. No  acute abnormality seen within the abdomen. 2. 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 Electronically Signed   By: Roanna RaiderJeffery  Chang M.D.   On: 07/30/2015 02:06   I have personally reviewed and evaluated these images and lab results as part of my medical decision-making.   EKG Interpretation None      MDM   Final diagnoses:  None    Patient presents to the ED for RUQ abd pain and nausea in the setting of pregnancy.  Will evaluate for gallbladder pathology with ultrasound. She was given tylenol for pain control and zofran for nausea.     US does not reveal a cause for her pain. These may be pregnancy pains.  Labs are unremarkable as well.  She was advised to continue tylenol as needed for pain and see OB/GYN within 3 days for close follow up.  She appears well and in NAD.  VS remain within her normal limits and she is safe for DC.  I personally performed the services described in this documentation, which was scribed in my presence. The recorded information has been reviewed and is accurate.     Tomasita Crumble, MD 07/30/15 [phone]

## 2015-07-29 NOTE — ED Notes (Signed)
Pt. reports generalized abdominal pain with nausea and low back pain onset this evening , denies emesis or diarrhea , no fever or chills. Pt. is [redacted] weeks pregnant denies contractions or vaginal discharge .

## 2015-07-30 ENCOUNTER — Emergency Department (HOSPITAL_COMMUNITY): Payer: Medicaid Other

## 2015-07-30 LAB — HCG, QUANTITATIVE, PREGNANCY: hCG, Beta Chain, Quant, S: 113042 m[IU]/mL — ABNORMAL HIGH (ref <5)

## 2015-07-30 MED ORDER — ACETAMINOPHEN 500 MG PO TABS
1000.0000 mg | ORAL_TABLET | Freq: Once | ORAL | Status: AC
  Administered 2015-07-30: 1000 mg via ORAL
  Filled 2015-07-30: qty 2

## 2015-07-30 MED ORDER — LACTATED RINGERS IV BOLUS (SEPSIS)
1000.0000 mL | Freq: Once | INTRAVENOUS | Status: AC
  Administered 2015-07-30: 1000 mL via INTRAVENOUS

## 2015-07-30 MED ORDER — ONDANSETRON HCL 4 MG/2ML IJ SOLN
4.0000 mg | Freq: Once | INTRAMUSCULAR | Status: AC
  Administered 2015-07-30: 4 mg via INTRAVENOUS
  Filled 2015-07-30: qty 2

## 2015-07-30 MED ORDER — SODIUM CHLORIDE 0.9 % IV BOLUS (SEPSIS): 1000.0000 mL | Freq: Once | INTRAVENOUS | Status: DC

## 2015-07-30 NOTE — Discharge Instructions (Signed)
First Trimester of Pregnancy Hannah Norris, your ultrasound does not show any cause for your abdominal pain.  These may be pains of pregnancy.  See an OB physician within 3 days for close follow up. Take tylenol as needed for pain control. If any symptoms worsen, come back to the ED immediately. Thank you. The first trimester of pregnancy is from week 1 until the end of week 12 (months 1 through 3). During this time, your baby will begin to develop inside you. At 6-8 weeks, the eyes and face are formed, and the heartbeat can be seen on ultrasound. At the end of 12 weeks, all the baby's organs are formed. Prenatal care is all the medical care you receive before the birth of your baby. Make sure you get good prenatal care and follow all of your doctor's instructions. HOME CARE  Medicines  Take medicine only as told by your doctor. Some medicines are safe and some are not during pregnancy.  Take your prenatal vitamins as told by your doctor.  Take medicine that helps you poop (stool softener) as needed if your doctor says it is okay. Diet  Eat regular, healthy meals.  Your doctor will tell you the amount of weight gain that is right for you.  Avoid raw meat and uncooked cheese.  If you feel sick to your stomach (nauseous) or throw up (vomit):  Eat 4 or 5 small meals a day instead of 3 large meals.  Try eating a few soda crackers.  Drink liquids between meals instead of during meals.  If you have a hard time pooping (constipation):  Eat high-fiber foods like fresh vegetables, fruit, and whole grains.  Drink enough fluids to keep your pee (urine) clear or pale yellow. Activity and Exercise  Exercise only as told by your doctor. Stop exercising if you have cramps or pain in your lower belly (abdomen) or low back.  Try to avoid standing for long periods of time. Move your legs often if you must stand in one place for a long time.  Avoid heavy lifting.  Wear low-heeled shoes. Sit and  stand up straight.  You can have sex unless your doctor tells you not to. Relief of Pain or Discomfort  Wear a good support bra if your breasts are sore.  Take warm water baths (sitz baths) to soothe pain or discomfort caused by hemorrhoids. Use hemorrhoid cream if your doctor says it is okay.  Rest with your legs raised if you have leg cramps or low back pain.  Wear support hose if you have puffy, bulging veins (varicose veins) in your legs. Raise (elevate) your feet for 15 minutes, 3-4 times a day. Limit salt in your diet. Prenatal Care  Schedule your prenatal visits by the twelfth week of pregnancy.  Write down your questions. Take them to your prenatal visits.  Keep all your prenatal visits as told by your doctor. Safety  Wear your seat belt at all times when driving.  Make a list of emergency phone numbers. The list should include numbers for family, friends, the hospital, and police and fire departments. General Tips  Ask your doctor for a referral to a local prenatal class. Begin classes no later than at the start of month 6 of your pregnancy.  Ask for help if you need counseling or help with nutrition. Your doctor can give you advice or tell you where to go for help.  Do not use hot tubs, steam rooms, or saunas.  Do not  douche or use tampons or scented sanitary pads.  Do not cross your legs for long periods of time.  Avoid litter boxes and soil used by cats.  Avoid all smoking, herbs, and alcohol. Avoid drugs not approved by your doctor.  Do not use any tobacco products, including cigarettes, chewing tobacco, and electronic cigarettes. If you need help quitting, ask your doctor. You may get counseling or other support to help you quit.  Visit your dentist. At home, brush your teeth with a soft toothbrush. Be gentle when you floss. GET HELP IF:  You are dizzy.  You have mild cramps or pressure in your lower belly.  You have a nagging pain in your belly  area.  You continue to feel sick to your stomach, throw up, or have watery poop (diarrhea).  You have a bad smelling fluid coming from your vagina.  You have pain with peeing (urination).  You have increased puffiness (swelling) in your face, hands, legs, or ankles. GET HELP RIGHT AWAY IF:   You have a fever.  You are leaking fluid from your vagina.  You have spotting or bleeding from your vagina.  You have very bad belly cramping or pain.  You gain or lose weight rapidly.  You throw up blood. It may look like coffee grounds.  You are around people who have [place] measles, fifth disease, or chickenpox.  You have a very bad headache.  You have shortness of breath.  You have any kind of trauma, such as from a fall or a car accident.   This information is not intended to replace advice given to you by your health care provider. Make sure you discuss any questions you have with your health care provider.   Document Released: 09/18/2007 Document Revised: 04/22/2014 Document Reviewed: 02/09/2013 Elsevier Interactive Patient Education [employer]2016 Elsevier Inc.

## 2015-07-30 NOTE — ED Notes (Signed)
Patient transported to Ultrasound 

## 2015-07-31 LAB — CBC WITH DIFFERENTIAL/PLATELET
BASOS: 0 %
Basophils Absolute: 0 10*3/uL (ref 0.0–0.2)
EOS (ABSOLUTE): 0.1 10*3/uL (ref 0.0–0.4)
EOS: 1 %
HEMATOCRIT: 37.7 % (ref 34.0–46.6)
HEMOGLOBIN: 12.8 g/dL (ref 11.1–15.9)
Immature Grans (Abs): 0 10*3/uL (ref 0.0–0.1)
Immature Granulocytes: 0 %
LYMPHS ABS: 2.5 10*3/uL (ref 0.7–3.1)
Lymphs: 45 %
MCH: 28.3 pg (ref 26.6–33.0)
MCHC: 34 g/dL (ref 31.5–35.7)
MCV: 83 fL (ref 79–97)
MONOCYTES: 8 %
MONOS ABS: 0.4 10*3/uL (ref 0.1–0.9)
Neutrophils Absolute: 2.6 10*3/uL (ref 1.4–7.0)
Neutrophils: 46 %
PLATELETS: 288 10*3/uL (ref 150–379)
RBC: 4.52 x10E6/uL (ref 3.77–5.28)
RDW: 14 % (ref 12.3–15.4)
WBC: 5.6 10*3/uL (ref 3.4–10.8)

## 2015-07-31 LAB — ABO

## 2015-07-31 LAB — TSH: TSH: 0.727 u[IU]/mL (ref 0.450–4.500)

## 2015-07-31 LAB — AFP, SERUM, OPEN SPINA BIFIDA

## 2015-07-31 LAB — HEPATITIS B SURFACE ANTIGEN: HEP B S AG: NEGATIVE

## 2015-07-31 LAB — VARICELLA ZOSTER ANTIBODY, IGM: Varicella IgM: <0.91 index (ref 0.00–0.90)

## 2015-07-31 LAB — HEMOGLOBIN A1C
ESTIMATED AVERAGE GLUCOSE: 97 mg/dL
Hgb A1c MFr Bld: 5 % (ref 4.8–5.6)

## 2015-07-31 LAB — RH TYPE: RH TYPE: POSITIVE

## 2015-07-31 LAB — HIV ANTIBODY (ROUTINE TESTING W REFLEX): HIV Screen 4th Generation wRfx: NONREACTIVE

## 2015-07-31 LAB — RUBELLA ANTIBODY, IGM

## 2015-07-31 LAB — RPR: RPR Ser Ql: NONREACTIVE

## 2015-07-31 LAB — ANTIBODY SCREEN: Antibody Screen: NEGATIVE

## 2015-07-31 LAB — SICKLE CELL SCREEN: Sickle Cell Screen: NEGATIVE

## 2015-08-02 LAB — URINE CULTURE, OB REFLEX

## 2015-08-02 LAB — CULTURE, OB URINE

## 2015-08-10 ENCOUNTER — Other Ambulatory Visit: Payer: Self-pay | Admitting: Obstetrics and Gynecology

## 2015-08-10 ENCOUNTER — Encounter: Payer: Self-pay | Admitting: Obstetrics and Gynecology

## 2015-08-10 ENCOUNTER — Ambulatory Visit (INDEPENDENT_AMBULATORY_CARE_PROVIDER_SITE_OTHER): Payer: Medicaid Other

## 2015-08-10 ENCOUNTER — Ambulatory Visit (INDEPENDENT_AMBULATORY_CARE_PROVIDER_SITE_OTHER): Payer: Medicaid Other | Admitting: Obstetrics and Gynecology

## 2015-08-10 VITALS — BP 97/63 | HR 91 | Wt 167.7 lb

## 2015-08-10 DIAGNOSIS — Z36 Encounter for antenatal screening of mother: Secondary | ICD-10-CM

## 2015-08-10 DIAGNOSIS — Z331 Pregnant state, incidental: Secondary | ICD-10-CM | POA: Diagnosis not present

## 2015-08-10 DIAGNOSIS — N870 Mild cervical dysplasia: Secondary | ICD-10-CM

## 2015-08-10 DIAGNOSIS — Z86711 Personal history of pulmonary embolism: Secondary | ICD-10-CM

## 2015-08-10 DIAGNOSIS — Z3490 Encounter for supervision of normal pregnancy, unspecified, unspecified trimester: Secondary | ICD-10-CM

## 2015-08-10 DIAGNOSIS — Z3481 Encounter for supervision of other normal pregnancy, first trimester: Secondary | ICD-10-CM | POA: Diagnosis not present

## 2015-08-10 DIAGNOSIS — A499 Bacterial infection, unspecified: Secondary | ICD-10-CM

## 2015-08-10 DIAGNOSIS — Z369 Encounter for antenatal screening, unspecified: Secondary | ICD-10-CM

## 2015-08-10 DIAGNOSIS — B9689 Other specified bacterial agents as the cause of diseases classified elsewhere: Secondary | ICD-10-CM

## 2015-08-10 DIAGNOSIS — Z3491 Encounter for supervision of normal pregnancy, unspecified, first trimester: Secondary | ICD-10-CM

## 2015-08-10 DIAGNOSIS — O09291 Supervision of pregnancy with other poor reproductive or obstetric history, first trimester: Secondary | ICD-10-CM

## 2015-08-10 DIAGNOSIS — N760 Acute vaginitis: Secondary | ICD-10-CM

## 2015-08-10 LAB — POCT URINALYSIS DIPSTICK
Bilirubin, UA: NEGATIVE
Blood, UA: NEGATIVE
Glucose, UA: NEGATIVE
Ketones, UA: NEGATIVE
Leukocytes, UA: NEGATIVE
Nitrite, UA: NEGATIVE
PH UA: 8
PROTEIN UA: NEGATIVE
Spec Grav, UA: <=1.005
UROBILINOGEN UA: NEGATIVE

## 2015-08-10 MED ORDER — METRONIDAZOLE 500 MG PO TABS: 500.0000 mg | ORAL_TABLET | Freq: Two times a day (BID) | ORAL | Status: DC
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
## 2015-08-13 ENCOUNTER — Encounter: Payer: Self-pay | Admitting: Obstetrics and Gynecology

## 2015-08-13 LAB — FIRST TRIMESTER SCREEN W/NT
CRL: 52 mm
DIA MOM: 0.55
DIA VALUE: 143.2 pg/mL
Gest Age-Collect: 11.9 weeks
HCG MOM: 0.72
Maternal Age At EDD: 24.3 years
NUMBER OF FETUSES: 1
Nuchal Translucency MoM: 0.93
Nuchal Translucency: 1.3 mm
PAPP-A MOM: 0.82
PAPP-A Value: 536 ng/mL
PDF: 0
TEST RESULTS: NEGATIVE
WEIGHT: 167 [lb_av]
hCG Value: 68.4 IU/mL

## 2015-08-29 ENCOUNTER — Telehealth: Payer: Self-pay | Admitting: Obstetrics and Gynecology

## 2015-08-29 DIAGNOSIS — B9689 Other specified bacterial agents as the cause of diseases classified elsewhere: Secondary | ICD-10-CM

## 2015-08-29 DIAGNOSIS — N760 Acute vaginitis: Principal | ICD-10-CM

## 2015-08-29 NOTE — Telephone Encounter (Signed)
[name] called saying the medication she was given for her infection seemed to be working initially but the symptoms returned. She's wondering if a refill of the same medication needs to be called in or if she needs to return for another appt. She'd like a phone call regarding this.  Pt's ph# [phone]  Thank you.

## 2015-08-30 MED ORDER — METRONIDAZOLE 0.75 % VA GEL: 1.0000 | Freq: Two times a day (BID) | VAGINAL | Status: DC

## 2015-08-30 NOTE — Telephone Encounter (Signed)
Called pt no answer, LM for pt informing her of Metrogel being sent in. Pt to follow up of sx are not resolved with this treatment. Encouraged pt to use mild soaps and detergents and also to eat yogurt and probiotics.

## 2015-09-13 ENCOUNTER — Encounter: Payer: Medicaid Other | Admitting: Obstetrics and Gynecology

## 2015-09-26 ENCOUNTER — Encounter: Payer: Medicaid Other | Admitting: Obstetrics and Gynecology

## 2015-10-05 ENCOUNTER — Ambulatory Visit (INDEPENDENT_AMBULATORY_CARE_PROVIDER_SITE_OTHER): Payer: Medicaid Other | Admitting: Obstetrics and Gynecology

## 2015-10-05 VITALS — BP 85/56 | HR 85 | Wt 176.9 lb

## 2015-10-05 DIAGNOSIS — Z3492 Encounter for supervision of normal pregnancy, unspecified, second trimester: Secondary | ICD-10-CM

## 2015-10-05 LAB — POCT URINALYSIS DIPSTICK
BILIRUBIN UA: NEGATIVE
Blood, UA: NEGATIVE
Glucose, UA: NEGATIVE
KETONES UA: NEGATIVE
LEUKOCYTES UA: NEGATIVE
NITRITE UA: NEGATIVE
Protein, UA: NEGATIVE
Spec Grav, UA: 1.01
Urobilinogen, UA: 1
pH, UA: 8

## 2015-10-05 NOTE — Progress Notes (Signed)
ROB: Patient notes feeling hungry "all the time", or nauseated if she goes too long without eating. Discussed healthy eating habits, snacks during the day. Had normal 1st trimester screen. For anatomy scan in 1-2 weeks, OB visit in 4 weeks.

## 2015-10-20 ENCOUNTER — Ambulatory Visit (INDEPENDENT_AMBULATORY_CARE_PROVIDER_SITE_OTHER): Payer: Medicaid Other

## 2015-10-20 DIAGNOSIS — Z3492 Encounter for supervision of normal pregnancy, unspecified, second trimester: Secondary | ICD-10-CM | POA: Diagnosis not present

## 2015-10-20 DIAGNOSIS — O4442 Low lying placenta NOS or without hemorrhage, second trimester: Secondary | ICD-10-CM

## 2015-11-02 ENCOUNTER — Encounter: Payer: Medicaid Other | Admitting: Obstetrics and Gynecology

## 2015-11-08 ENCOUNTER — Encounter: Payer: Self-pay | Admitting: Obstetrics and Gynecology

## 2015-11-08 ENCOUNTER — Ambulatory Visit (INDEPENDENT_AMBULATORY_CARE_PROVIDER_SITE_OTHER): Payer: Medicaid Other | Admitting: Obstetrics and Gynecology

## 2015-11-08 VITALS — BP 94/64 | HR 83 | Wt 181.8 lb

## 2015-11-08 DIAGNOSIS — Z131 Encounter for screening for diabetes mellitus: Secondary | ICD-10-CM

## 2015-11-08 DIAGNOSIS — Z3492 Encounter for supervision of normal pregnancy, unspecified, second trimester: Secondary | ICD-10-CM

## 2015-11-08 DIAGNOSIS — Z3482 Encounter for supervision of other normal pregnancy, second trimester: Secondary | ICD-10-CM

## 2015-11-08 LAB — POCT URINALYSIS DIPSTICK
Bilirubin, UA: NEGATIVE
GLUCOSE UA: NEGATIVE
Ketones, UA: NEGATIVE
LEUKOCYTES UA: NEGATIVE
NITRITE UA: NEGATIVE
PH UA: 8
Protein, UA: NEGATIVE
RBC UA: NEGATIVE
Spec Grav, UA: 1.01
UROBILINOGEN UA: NEGATIVE

## 2015-11-08 NOTE — Progress Notes (Addendum)
ROB: Patient doing well, denies complaints.  RTC in 4 weeks, for 28 week labs at that time. Will need repeat scan at 28-30 weeks to recheck low-lying placenta.

## 2015-11-13 DIAGNOSIS — O4442 Low lying placenta NOS or without hemorrhage, second trimester: Secondary | ICD-10-CM | POA: Insufficient documentation

## 2015-12-06 ENCOUNTER — Ambulatory Visit (INDEPENDENT_AMBULATORY_CARE_PROVIDER_SITE_OTHER): Payer: Medicaid Other

## 2015-12-06 ENCOUNTER — Ambulatory Visit (INDEPENDENT_AMBULATORY_CARE_PROVIDER_SITE_OTHER): Payer: Medicaid Other | Admitting: Obstetrics and Gynecology

## 2015-12-06 ENCOUNTER — Other Ambulatory Visit: Payer: Self-pay | Admitting: Obstetrics and Gynecology

## 2015-12-06 ENCOUNTER — Other Ambulatory Visit: Payer: Medicaid Other

## 2015-12-06 VITALS — BP 90/58 | HR 96 | Wt 186.0 lb

## 2015-12-06 DIAGNOSIS — Z131 Encounter for screening for diabetes mellitus: Secondary | ICD-10-CM

## 2015-12-06 DIAGNOSIS — Z3492 Encounter for supervision of normal pregnancy, unspecified, second trimester: Secondary | ICD-10-CM | POA: Diagnosis not present

## 2015-12-06 DIAGNOSIS — B9689 Other specified bacterial agents as the cause of diseases classified elsewhere: Secondary | ICD-10-CM

## 2015-12-06 DIAGNOSIS — Z3483 Encounter for supervision of other normal pregnancy, third trimester: Secondary | ICD-10-CM

## 2015-12-06 DIAGNOSIS — O4442 Low lying placenta NOS or without hemorrhage, second trimester: Secondary | ICD-10-CM

## 2015-12-06 DIAGNOSIS — Z3493 Encounter for supervision of normal pregnancy, unspecified, third trimester: Secondary | ICD-10-CM

## 2015-12-06 DIAGNOSIS — Z23 Encounter for immunization: Secondary | ICD-10-CM | POA: Diagnosis not present

## 2015-12-06 DIAGNOSIS — Z3480 Encounter for supervision of other normal pregnancy, unspecified trimester: Secondary | ICD-10-CM | POA: Insufficient documentation

## 2015-12-06 DIAGNOSIS — Z3482 Encounter for supervision of other normal pregnancy, second trimester: Secondary | ICD-10-CM

## 2015-12-06 DIAGNOSIS — N760 Acute vaginitis: Secondary | ICD-10-CM

## 2015-12-06 DIAGNOSIS — A499 Bacterial infection, unspecified: Secondary | ICD-10-CM

## 2015-12-06 LAB — POCT URINALYSIS DIPSTICK
BILIRUBIN UA: NEGATIVE
GLUCOSE UA: NEGATIVE
Ketones, UA: NEGATIVE
NITRITE UA: NEGATIVE
Protein, UA: NEGATIVE
RBC UA: NEGATIVE
Spec Grav, UA: 1.015
UROBILINOGEN UA: NEGATIVE
pH, UA: 7

## 2015-12-06 MED ORDER — METRONIDAZOLE 500 MG PO TABS: 500.0000 mg | ORAL_TABLET | Freq: Two times a day (BID) | ORAL | 0 refills | Status: DC

## 2015-12-06 MED ORDER — TETANUS-DIPHTH-ACELL PERTUSSIS 5-2.5-18.5 LF-MCG/0.5 IM SUSP
0.5000 mL | Freq: Once | INTRAMUSCULAR | Status: AC
  Administered 2015-12-06: 0.5 mL via INTRAMUSCULAR

## 2015-12-06 NOTE — Progress Notes (Signed)
ROB: C/o vaginal discharge, yellowish, intermittent itching and vulvar swelling. Microscopic wet-mount exam shows clue cells, no trichomonads or yeast, KOH done.  For 28 week labs today.  Desires to breastfeed,  Hannah Norris of desires  for contraception. To discuss further at subsequent visits. For Tdap today, signed blood consent, discussed cord blood banking. For repeat ultrasound for low-lying placenta. RTC in 2 weeks.

## 2015-12-07 LAB — SPECIMEN STATUS REPORT

## 2015-12-07 LAB — CBC
Hematocrit: 33 % — ABNORMAL LOW (ref 34.0–46.6)
Hemoglobin: 11.1 g/dL (ref 11.1–15.9)
MCH: 29.6 pg (ref 26.6–33.0)
MCHC: 33.6 g/dL (ref 31.5–35.7)
MCV: 88 fL (ref 79–97)
Platelets: 206 10*3/uL (ref 150–379)
RBC: 3.75 x10E6/uL — AB (ref 3.77–5.28)
RDW: 14.1 % (ref 12.3–15.4)
WBC: 5.6 10*3/uL (ref 3.4–10.8)

## 2015-12-19 ENCOUNTER — Telehealth: Payer: Self-pay | Admitting: Obstetrics and Gynecology

## 2015-12-19 ENCOUNTER — Encounter: Payer: Medicaid Other | Admitting: Obstetrics and Gynecology

## 2015-12-19 NOTE — Telephone Encounter (Signed)
Yes it is ok for pt to miss this weeks appt.

## 2015-12-19 NOTE — Telephone Encounter (Signed)
Hannah Norris called and left a msg Friday after hrs and I called her back. She has to work today and cant come in. It was her 2wk ROB. She said she would just keep her next appt since Dr. Valentino Saxonherry is not here next week. Just wanted to let you know in case you wanted me to call her back and have her come in and see someone else, She is having NO problems

## 2016-01-03 ENCOUNTER — Ambulatory Visit (INDEPENDENT_AMBULATORY_CARE_PROVIDER_SITE_OTHER): Payer: Medicaid Other

## 2016-01-03 ENCOUNTER — Ambulatory Visit (INDEPENDENT_AMBULATORY_CARE_PROVIDER_SITE_OTHER): Payer: Medicaid Other | Admitting: Obstetrics and Gynecology

## 2016-01-03 VITALS — BP 100/64 | HR 91 | Wt 191.4 lb

## 2016-01-03 DIAGNOSIS — R110 Nausea: Secondary | ICD-10-CM | POA: Insufficient documentation

## 2016-01-03 DIAGNOSIS — O4442 Low lying placenta NOS or without hemorrhage, second trimester: Secondary | ICD-10-CM

## 2016-01-03 DIAGNOSIS — O26843 Uterine size-date discrepancy, third trimester: Secondary | ICD-10-CM

## 2016-01-03 DIAGNOSIS — Z3493 Encounter for supervision of normal pregnancy, unspecified, third trimester: Secondary | ICD-10-CM

## 2016-01-03 DIAGNOSIS — O403XX0 Polyhydramnios, third trimester, not applicable or unspecified: Secondary | ICD-10-CM

## 2016-01-03 DIAGNOSIS — Z3483 Encounter for supervision of other normal pregnancy, third trimester: Secondary | ICD-10-CM

## 2016-01-03 LAB — POCT URINALYSIS DIPSTICK
Bilirubin, UA: NEGATIVE
Glucose, UA: NEGATIVE
KETONES UA: 15
NITRITE UA: NEGATIVE
PH UA: 8
PROTEIN UA: NEGATIVE
RBC UA: NEGATIVE
Spec Grav, UA: 1.01
Urobilinogen, UA: NEGATIVE

## 2016-01-03 NOTE — Progress Notes (Signed)
ROB: Notes feeling occasional nausea.  Advised on ginger products, Vitamin B6, Doxylamine.  Size greater than dates again today, will get growth scan.  Low-lying placenta resolved on last scan. RTC in 2 weeks. For flu vaccine next visit.

## 2016-01-07 DIAGNOSIS — O403XX0 Polyhydramnios, third trimester, not applicable or unspecified: Secondary | ICD-10-CM | POA: Insufficient documentation

## 2016-01-11 ENCOUNTER — Other Ambulatory Visit: Payer: Medicaid Other

## 2016-01-11 ENCOUNTER — Ambulatory Visit (INDEPENDENT_AMBULATORY_CARE_PROVIDER_SITE_OTHER): Payer: Medicaid Other | Admitting: Obstetrics and Gynecology

## 2016-01-11 VITALS — BP 95/50 | HR 92 | Wt 190.0 lb

## 2016-01-11 DIAGNOSIS — O403XX0 Polyhydramnios, third trimester, not applicable or unspecified: Secondary | ICD-10-CM | POA: Diagnosis not present

## 2016-01-11 NOTE — Progress Notes (Signed)
Problem OB: Patient c/o decreased FM since last night.  Tried drinking something cold.  Notes fetal movement began again on arrival to clinic today.  NST performed today was reviewed and was found to be reactive. Informed patient of recent polyhydramnios diagnosis (AFI 22 cm on scan last week), which could also contribute to sensation of decreased FM.  Continue recommended antenatal testing and prenatal care.  To f/u in 1 week for routine OB visit and repeat AFI check.

## 2016-01-11 NOTE — Progress Notes (Signed)
NONSTRESS TEST INTERPRETATION  INDICATIONS: Decreased fetal movement  FHR baseline: 130bpm RESULTS:Reactive COMMENTS: Pt seen by provider   PLAN: 1. Continue fetal kick counts twice a day. 2. Continue antepartum testing as scheduled-Biweekly 3. Return as scheduled   [place] Judice, CMA

## 2016-01-11 NOTE — Patient Instructions (Signed)
Fetal Movement Counts  Patient Name: __________________________________________________ Patient Due Date: ____________________  Performing a fetal movement count is highly recommended in high-risk pregnancies, but it is good for every pregnant woman to do. Your health care provider may ask you to start counting fetal movements at 28 weeks of the pregnancy. Fetal movements often increase:  · After eating a full meal.  · After physical activity.  · After eating or drinking something sweet or cold.  · At rest.  Pay attention to when you feel the baby is most active. This will help you notice a pattern of your baby's sleep and wake cycles and what factors contribute to an increase in fetal movement. It is important to perform a fetal movement count at the same time each day when your baby is normally most active.   HOW TO COUNT FETAL MOVEMENTS  1. Find a quiet and comfortable area to sit or lie down on your left side. Lying on your left side provides the best blood and oxygen circulation to your baby.  2. Write down the day and time on a sheet of paper or in a journal.  3. Start counting kicks, flutters, swishes, rolls, or jabs in a 2-hour period. You should feel at least 10 movements within 2 hours.  4. If you do not feel 10 movements in 2 hours, wait 2-3 hours and count again. Look for a change in the pattern or not enough counts in 2 hours.  SEEK MEDICAL CARE IF:  · You feel less than 10 counts in 2 hours, tried twice.  · There is no movement in over an hour.  · The pattern is changing or taking longer each day to reach 10 counts in 2 hours.  · You feel the baby is not moving as he or she usually does.  Date: ____________ Movements: ____________ Start time: ____________ Finish time: ____________   Date: ____________ Movements: ____________ Start time: ____________ Finish time: ____________  Date: ____________ Movements: ____________ Start time: ____________ Finish time: ____________  Date: ____________ Movements:  ____________ Start time: ____________ Finish time: ____________  Date: ____________ Movements: ____________ Start time: ____________ Finish time: ____________  Date: ____________ Movements: ____________ Start time: ____________ Finish time: ____________  Date: ____________ Movements: ____________ Start time: ____________ Finish time: ____________  Date: ____________ Movements: ____________ Start time: ____________ Finish time: ____________   Date: ____________ Movements: ____________ Start time: ____________ Finish time: ____________  Date: ____________ Movements: ____________ Start time: ____________ Finish time: ____________  Date: ____________ Movements: ____________ Start time: ____________ Finish time: ____________  Date: ____________ Movements: ____________ Start time: ____________ Finish time: ____________  Date: ____________ Movements: ____________ Start time: ____________ Finish time: ____________  Date: ____________ Movements: ____________ Start time: ____________ Finish time: ____________  Date: ____________ Movements: ____________ Start time: ____________ Finish time: ____________   Date: ____________ Movements: ____________ Start time: ____________ Finish time: ____________  Date: ____________ Movements: ____________ Start time: ____________ Finish time: ____________  Date: ____________ Movements: ____________ Start time: ____________ Finish time: ____________  Date: ____________ Movements: ____________ Start time: ____________ Finish time: ____________  Date: ____________ Movements: ____________ Start time: ____________ Finish time: ____________  Date: ____________ Movements: ____________ Start time: ____________ Finish time: ____________  Date: ____________ Movements: ____________ Start time: ____________ Finish time: ____________   Date: ____________ Movements: ____________ Start time: ____________ Finish time: ____________  Date: ____________ Movements: ____________ Start time: ____________ Finish  time: ____________  Date: ____________ Movements: ____________ Start time: ____________ Finish time: ____________  Date: ____________ Movements: ____________ Start time:   ____________ Finish time: ____________  Date: ____________ Movements: ____________ Start time: ____________ Finish time: ____________  Date: ____________ Movements: ____________ Start time: ____________ Finish time: ____________  Date: ____________ Movements: ____________ Start time: ____________ Finish time: ____________   Date: ____________ Movements: ____________ Start time: ____________ Finish time: ____________  Date: ____________ Movements: ____________ Start time: ____________ Finish time: ____________  Date: ____________ Movements: ____________ Start time: ____________ Finish time: ____________  Date: ____________ Movements: ____________ Start time: ____________ Finish time: ____________  Date: ____________ Movements: ____________ Start time: ____________ Finish time: ____________  Date: ____________ Movements: ____________ Start time: ____________ Finish time: ____________  Date: ____________ Movements: ____________ Start time: ____________ Finish time: ____________   Date: ____________ Movements: ____________ Start time: ____________ Finish time: ____________  Date: ____________ Movements: ____________ Start time: ____________ Finish time: ____________  Date: ____________ Movements: ____________ Start time: ____________ Finish time: ____________  Date: ____________ Movements: ____________ Start time: ____________ Finish time: ____________  Date: ____________ Movements: ____________ Start time: ____________ Finish time: ____________  Date: ____________ Movements: ____________ Start time: ____________ Finish time: ____________  Date: ____________ Movements: ____________ Start time: ____________ Finish time: ____________   Date: ____________ Movements: ____________ Start time: ____________ Finish time: ____________  Date: ____________  Movements: ____________ Start time: ____________ Finish time: ____________  Date: ____________ Movements: ____________ Start time: ____________ Finish time: ____________  Date: ____________ Movements: ____________ Start time: ____________ Finish time: ____________  Date: ____________ Movements: ____________ Start time: ____________ Finish time: ____________  Date: ____________ Movements: ____________ Start time: ____________ Finish time: ____________  Date: ____________ Movements: ____________ Start time: ____________ Finish time: ____________   Date: ____________ Movements: ____________ Start time: ____________ Finish time: ____________  Date: ____________ Movements: ____________ Start time: ____________ Finish time: ____________  Date: ____________ Movements: ____________ Start time: ____________ Finish time: ____________  Date: ____________ Movements: ____________ Start time: ____________ Finish time: ____________  Date: ____________ Movements: ____________ Start time: ____________ Finish time: ____________  Date: ____________ Movements: ____________ Start time: ____________ Finish time: ____________     This information is not intended to replace advice given to you by your health care provider. Make sure you discuss any questions you have with your health care provider.     Document Released: 05/01/2006 Document Revised: 04/22/2014 Document Reviewed: 01/27/2012  Elsevier Interactive Patient Education ©2016 [employer]

## 2016-01-16 ENCOUNTER — Encounter: Payer: Medicaid Other | Admitting: Obstetrics and Gynecology

## 2016-01-17 ENCOUNTER — Ambulatory Visit (INDEPENDENT_AMBULATORY_CARE_PROVIDER_SITE_OTHER): Payer: Medicaid Other

## 2016-01-17 ENCOUNTER — Ambulatory Visit (INDEPENDENT_AMBULATORY_CARE_PROVIDER_SITE_OTHER): Payer: Medicaid Other | Admitting: Obstetrics and Gynecology

## 2016-01-17 VITALS — BP 93/63 | HR 92 | Wt 192.2 lb

## 2016-01-17 DIAGNOSIS — O403XX0 Polyhydramnios, third trimester, not applicable or unspecified: Secondary | ICD-10-CM

## 2016-01-17 DIAGNOSIS — Z3483 Encounter for supervision of other normal pregnancy, third trimester: Secondary | ICD-10-CM

## 2016-01-17 LAB — POCT URINALYSIS DIPSTICK
Bilirubin, UA: NEGATIVE
Glucose, UA: NEGATIVE
Ketones, UA: NEGATIVE
Leukocytes, UA: NEGATIVE
NITRITE UA: NEGATIVE
PROTEIN UA: NEGATIVE
RBC UA: NEGATIVE
SPEC GRAV UA: 1.015
UROBILINOGEN UA: NEGATIVE
pH, UA: 6.5

## 2016-01-17 NOTE — Progress Notes (Signed)
ROB: Doing well, no complaints. RTC in 2 weeks, for repeat AFI then for polyhdramnios, then weekly after 36 weeks.  For weekly NSTs at 36 weeks.

## 2016-01-18 ENCOUNTER — Other Ambulatory Visit: Payer: Medicaid Other

## 2016-01-30 ENCOUNTER — Encounter: Payer: Medicaid Other | Admitting: Obstetrics and Gynecology

## 2016-01-31 ENCOUNTER — Other Ambulatory Visit: Payer: Medicaid Other

## 2016-01-31 ENCOUNTER — Ambulatory Visit (INDEPENDENT_AMBULATORY_CARE_PROVIDER_SITE_OTHER): Payer: Medicaid Other

## 2016-01-31 ENCOUNTER — Ambulatory Visit (INDEPENDENT_AMBULATORY_CARE_PROVIDER_SITE_OTHER): Payer: Medicaid Other | Admitting: Obstetrics and Gynecology

## 2016-01-31 VITALS — BP 94/54 | HR 86 | Wt 192.3 lb

## 2016-01-31 DIAGNOSIS — Z23 Encounter for immunization: Secondary | ICD-10-CM

## 2016-01-31 DIAGNOSIS — Z3685 Encounter for antenatal screening for Streptococcus B: Secondary | ICD-10-CM

## 2016-01-31 DIAGNOSIS — Z3483 Encounter for supervision of other normal pregnancy, third trimester: Secondary | ICD-10-CM

## 2016-01-31 DIAGNOSIS — O403XX0 Polyhydramnios, third trimester, not applicable or unspecified: Secondary | ICD-10-CM

## 2016-01-31 DIAGNOSIS — Z113 Encounter for screening for infections with a predominantly sexual mode of transmission: Secondary | ICD-10-CM

## 2016-01-31 LAB — POCT URINALYSIS DIPSTICK
BILIRUBIN UA: NEGATIVE
GLUCOSE UA: NEGATIVE
Ketones, UA: NEGATIVE
Leukocytes, UA: NEGATIVE
Nitrite, UA: NEGATIVE
RBC UA: NEGATIVE
Spec Grav, UA: 1.015
Urobilinogen, UA: NEGATIVE
pH, UA: 6

## 2016-01-31 NOTE — Progress Notes (Signed)
NONSTRESS TEST INTERPRETATION  INDICATIONS: decreased fetal movement  FHR baseline: 140 RESULTS: reactive COMMENTS: NST B/P'S:  B/P-98/58,  P-89    PLAN: 1. Continue fetal kick counts twice a day. 2. Continue antepartum testing as scheduled-Biweekly 3. F/U as scheduled.  Fenton Mallingebbie Lizandra Zakrzewski, LPN

## 2016-01-31 NOTE — Patient Instructions (Signed)

## 2016-01-31 NOTE — Progress Notes (Signed)
ROB: Notes intermittent ctx, mild and pelvic pressure.  S/p NST, reactive.  Continue weekly NSTs for polyhydramnios.  Recent AFI 23.6 (increased from 22 cm last scan).  Continue weekly AFI checks.  Labor precautions given.  For 36 week labs today, for flu vaccine today. RTC in 1 week.

## 2016-02-01 LAB — CULTURE, BETA STREP (GROUP B ONLY)

## 2016-02-02 LAB — GC/CHLAMYDIA PROBE AMP
CHLAMYDIA, DNA PROBE: NEGATIVE
NEISSERIA GONORRHOEAE BY PCR: NEGATIVE

## 2016-02-07 ENCOUNTER — Ambulatory Visit (INDEPENDENT_AMBULATORY_CARE_PROVIDER_SITE_OTHER): Payer: Medicaid Other

## 2016-02-07 ENCOUNTER — Ambulatory Visit (INDEPENDENT_AMBULATORY_CARE_PROVIDER_SITE_OTHER): Payer: Medicaid Other | Admitting: Obstetrics and Gynecology

## 2016-02-07 ENCOUNTER — Other Ambulatory Visit: Payer: Self-pay | Admitting: Obstetrics and Gynecology

## 2016-02-07 ENCOUNTER — Ambulatory Visit: Payer: Medicaid Other

## 2016-02-07 VITALS — BP 94/54 | HR 69 | Wt 191.7 lb

## 2016-02-07 DIAGNOSIS — Z3483 Encounter for supervision of other normal pregnancy, third trimester: Secondary | ICD-10-CM

## 2016-02-07 DIAGNOSIS — O409XX0 Polyhydramnios, unspecified trimester, not applicable or unspecified: Secondary | ICD-10-CM

## 2016-02-07 DIAGNOSIS — O403XX0 Polyhydramnios, third trimester, not applicable or unspecified: Secondary | ICD-10-CM

## 2016-02-07 DIAGNOSIS — Z3685 Encounter for antenatal screening for Streptococcus B: Secondary | ICD-10-CM

## 2016-02-07 LAB — POCT URINALYSIS DIPSTICK
BILIRUBIN UA: NEGATIVE
GLUCOSE UA: NEGATIVE
Ketones, UA: NEGATIVE
Nitrite, UA: NEGATIVE
PH UA: 6.5
Protein, UA: NEGATIVE
RBC UA: NEGATIVE
SPEC GRAV UA: 1.02
Urobilinogen, UA: NEGATIVE

## 2016-02-07 LAB — OB RESULTS CONSOLE GBS: GBS: POSITIVE

## 2016-02-07 NOTE — Progress Notes (Signed)
NONSTRESS TEST INTERPRETATION  INDICATIONS: Decreased fetal movement and Obesity  FHR baseline: 150bpm RESULTS:Reactive COMMENTS: U/S and NST today    PLAN: 1. Continue fetal kick counts twice a day. 2. Continue antepartum testing as scheduled-Biweekly 3. To see provider today   Debbe Baleskinawa Kellan Raffield, CMA

## 2016-02-09 ENCOUNTER — Other Ambulatory Visit: Payer: Self-pay | Admitting: Obstetrics and Gynecology

## 2016-02-09 DIAGNOSIS — O409XX0 Polyhydramnios, unspecified trimester, not applicable or unspecified: Secondary | ICD-10-CM

## 2016-02-11 ENCOUNTER — Encounter: Payer: Self-pay | Admitting: Obstetrics and Gynecology

## 2016-02-11 DIAGNOSIS — B951 Streptococcus, group B, as the cause of diseases classified elsewhere: Secondary | ICD-10-CM | POA: Insufficient documentation

## 2016-02-11 LAB — CULTURE, BETA STREP (GROUP B ONLY): STREP GP B CULTURE: POSITIVE — AB

## 2016-02-14 ENCOUNTER — Ambulatory Visit (INDEPENDENT_AMBULATORY_CARE_PROVIDER_SITE_OTHER): Payer: Medicaid Other

## 2016-02-14 ENCOUNTER — Ambulatory Visit (INDEPENDENT_AMBULATORY_CARE_PROVIDER_SITE_OTHER): Payer: Medicaid Other | Admitting: Obstetrics and Gynecology

## 2016-02-14 VITALS — BP 92/54 | HR 86 | Wt 191.8 lb

## 2016-02-14 DIAGNOSIS — B951 Streptococcus, group B, as the cause of diseases classified elsewhere: Secondary | ICD-10-CM

## 2016-02-14 DIAGNOSIS — O368131 Decreased fetal movements, third trimester, fetus 1: Secondary | ICD-10-CM

## 2016-02-14 DIAGNOSIS — O409XX0 Polyhydramnios, unspecified trimester, not applicable or unspecified: Secondary | ICD-10-CM

## 2016-02-14 DIAGNOSIS — Z3483 Encounter for supervision of other normal pregnancy, third trimester: Secondary | ICD-10-CM

## 2016-02-14 DIAGNOSIS — Z369 Encounter for antenatal screening, unspecified: Secondary | ICD-10-CM

## 2016-02-14 DIAGNOSIS — O403XX0 Polyhydramnios, third trimester, not applicable or unspecified: Secondary | ICD-10-CM

## 2016-02-14 DIAGNOSIS — Z1389 Encounter for screening for other disorder: Secondary | ICD-10-CM

## 2016-02-14 LAB — POCT URINALYSIS DIPSTICK
Bilirubin, UA: NEGATIVE
Blood, UA: NEGATIVE
GLUCOSE UA: NEGATIVE
Ketones, UA: NEGATIVE
Leukocytes, UA: NEGATIVE
NITRITE UA: NEGATIVE
Protein, UA: NEGATIVE
Spec Grav, UA: 1.01
Urobilinogen, UA: NEGATIVE
pH, UA: 8

## 2016-02-14 NOTE — Progress Notes (Signed)
NONSTRESS TEST INTERPRETATION  INDICATIONS: decreased fetal movement, obesity  FHR baseline:  130-140 RESULTS: reactive COMMENTS: B/P-92/B/P-98/5156 P-88,  B/P-99/48 P-91, B/P-98/51 P-88,B/P-98/53 P-83, B/P-95/53 P-82, B/P-101/54     PLAN: 1. Continue fetal kick counts twice a day. 2. Continue antepartum testing as scheduled-Biweekly   Fenton Mallingebbie Christyanna Mckeon, LPN

## 2016-02-14 NOTE — Progress Notes (Signed)
ROB: Patient doing well, no complaints. NST performed today was reviewed and was found to be reactive.  Continue recommended antenatal testing and prenatal care.  Can d/c future antenatal testing as AFI borderline elevated 21 cm.  GBS+, will need abx in labor. RTC in 1 week.

## 2016-02-21 ENCOUNTER — Other Ambulatory Visit: Payer: Medicaid Other

## 2016-02-21 ENCOUNTER — Ambulatory Visit (INDEPENDENT_AMBULATORY_CARE_PROVIDER_SITE_OTHER): Payer: Medicaid Other | Admitting: Obstetrics and Gynecology

## 2016-02-21 VITALS — BP 86/56 | HR 93 | Wt 195.7 lb

## 2016-02-21 DIAGNOSIS — O403XX0 Polyhydramnios, third trimester, not applicable or unspecified: Secondary | ICD-10-CM

## 2016-02-21 DIAGNOSIS — Z3483 Encounter for supervision of other normal pregnancy, third trimester: Secondary | ICD-10-CM

## 2016-02-21 LAB — POCT URINALYSIS DIPSTICK
BILIRUBIN UA: NEGATIVE
Blood, UA: NEGATIVE
Glucose, UA: NEGATIVE
KETONES UA: NEGATIVE
Nitrite, UA: NEGATIVE
SPEC GRAV UA: 1.02
Urobilinogen, UA: 1
pH, UA: 6

## 2016-02-24 ENCOUNTER — Inpatient Hospital Stay
Admission: EM | Admit: 2016-02-24 | Discharge: 2016-02-26 | DRG: 775 | Disposition: A | Discharge status: Home / Self Care | Payer: Medicaid Other | Attending: Obstetrics and Gynecology | Admitting: Obstetrics and Gynecology

## 2016-02-24 ENCOUNTER — Encounter: Payer: Self-pay | Admitting: Anesthesiology

## 2016-02-24 DIAGNOSIS — O99824 Streptococcus B carrier state complicating childbirth: Secondary | ICD-10-CM | POA: Diagnosis present

## 2016-02-24 DIAGNOSIS — Z86711 Personal history of pulmonary embolism: Secondary | ICD-10-CM

## 2016-02-24 DIAGNOSIS — Z833 Family history of diabetes mellitus: Secondary | ICD-10-CM | POA: Diagnosis not present

## 2016-02-24 DIAGNOSIS — O403XX0 Polyhydramnios, third trimester, not applicable or unspecified: Secondary | ICD-10-CM | POA: Diagnosis present

## 2016-02-24 DIAGNOSIS — Z3A39 39 weeks gestation of pregnancy: Secondary | ICD-10-CM | POA: Diagnosis not present

## 2016-02-24 DIAGNOSIS — K219 Gastro-esophageal reflux disease without esophagitis: Secondary | ICD-10-CM | POA: Diagnosis present

## 2016-02-24 DIAGNOSIS — Z3493 Encounter for supervision of normal pregnancy, unspecified, third trimester: Secondary | ICD-10-CM

## 2016-02-24 DIAGNOSIS — O9962 Diseases of the digestive system complicating childbirth: Secondary | ICD-10-CM | POA: Diagnosis present

## 2016-02-24 DIAGNOSIS — Z3403 Encounter for supervision of normal first pregnancy, third trimester: Secondary | ICD-10-CM | POA: Diagnosis present

## 2016-02-24 LAB — CBC
HEMATOCRIT: 35.4 % (ref 35.0–47.0)
HEMOGLOBIN: 12.3 g/dL (ref 12.0–16.0)
MCH: 30.6 pg (ref 26.0–34.0)
MCHC: 34.8 g/dL (ref 32.0–36.0)
MCV: 87.9 fL (ref 80.0–100.0)
Platelets: 241 10*3/uL (ref 150–440)
RBC: 4.03 MIL/uL (ref 3.80–5.20)
RDW: 14.6 % — ABNORMAL HIGH (ref 11.5–14.5)
WBC: 8.7 10*3/uL (ref 3.6–11.0)

## 2016-02-24 LAB — TYPE AND SCREEN
ABO/RH(D): O POS
ANTIBODY SCREEN: NEGATIVE

## 2016-02-24 MED ORDER — LIDOCAINE HCL (PF) 1 % IJ SOLN
30.0000 mL | INTRAMUSCULAR | Status: DC | PRN
  Administered 2016-02-24: 30 mL via SUBCUTANEOUS

## 2016-02-24 MED ORDER — OXYCODONE-ACETAMINOPHEN 5-325 MG PO TABS: 2.0000 | ORAL_TABLET | ORAL | Status: DC | PRN

## 2016-02-24 MED ORDER — SODIUM CHLORIDE 0.9 % IV SOLN: 1.0000 g | INTRAVENOUS | Status: DC

## 2016-02-24 MED ORDER — OXYTOCIN BOLUS FROM INFUSION
500.0000 mL | Freq: Once | INTRAVENOUS | Status: AC
  Administered 2016-02-24: 500 mL via INTRAVENOUS

## 2016-02-24 MED ORDER — LACTATED RINGERS IV SOLN
INTRAVENOUS | Status: DC
  Administered 2016-02-24: 17:00:00 via INTRAVENOUS

## 2016-02-24 MED ORDER — OXYCODONE-ACETAMINOPHEN 5-325 MG PO TABS: 1.0000 | ORAL_TABLET | ORAL | Status: DC | PRN

## 2016-02-24 MED ORDER — SODIUM CHLORIDE 0.9 % IV SOLN
1.0000 g | INTRAVENOUS | Status: DC
  Administered 2016-02-24: 1 g via INTRAVENOUS
  Filled 2016-02-24 (×2): qty 1000

## 2016-02-24 MED ORDER — SODIUM CHLORIDE 0.9 % IV SOLN
INTRAVENOUS | Status: AC
  Administered 2016-02-24: 2 g via INTRAVENOUS
  Filled 2016-02-24: qty 2000

## 2016-02-24 MED ORDER — ACETAMINOPHEN 325 MG PO TABS
650.0000 mg | ORAL_TABLET | ORAL | Status: DC | PRN
  Filled 2016-02-24: qty 2

## 2016-02-24 MED ORDER — ONDANSETRON HCL 4 MG/2ML IJ SOLN
4.0000 mg | Freq: Four times a day (QID) | INTRAMUSCULAR | Status: DC | PRN
  Administered 2016-02-24: 4 mg via INTRAVENOUS
  Filled 2016-02-24: qty 2

## 2016-02-24 MED ORDER — SODIUM CHLORIDE 0.9 % IV SOLN
2.0000 g | Freq: Once | INTRAVENOUS | Status: AC
  Administered 2016-02-24: 2 g via INTRAVENOUS

## 2016-02-24 MED ORDER — MISOPROSTOL 200 MCG PO TABS
ORAL_TABLET | ORAL | Status: AC
  Filled 2016-02-24: qty 4

## 2016-02-24 MED ORDER — LACTATED RINGERS IV SOLN: 500.0000 mL | INTRAVENOUS | Status: DC | PRN

## 2016-02-24 MED ORDER — OXYCODONE-ACETAMINOPHEN 5-325 MG PO TABS
1.0000 | ORAL_TABLET | ORAL | Status: DC | PRN
  Administered 2016-02-24 – 2016-02-25 (×2): 1 via ORAL
  Filled 2016-02-24 (×2): qty 1

## 2016-02-24 MED ORDER — FENTANYL 2.5 MCG/ML W/ROPIVACAINE 0.2% IN NS 100 ML EPIDURAL INFUSION (ARMC-ANES)
EPIDURAL | Status: AC
  Filled 2016-02-24: qty 100

## 2016-02-24 MED ORDER — OXYTOCIN 40 UNITS IN LACTATED RINGERS INFUSION - SIMPLE MED
2.5000 [IU]/h | INTRAVENOUS | Status: DC
  Filled 2016-02-24: qty 1000

## 2016-02-24 MED ORDER — FENTANYL CITRATE (PF) 100 MCG/2ML IJ SOLN
50.0000 ug | INTRAMUSCULAR | Status: DC | PRN
  Administered 2016-02-24: 50 ug via INTRAVENOUS
  Filled 2016-02-24: qty 2

## 2016-02-24 MED ORDER — OXYTOCIN 10 UNIT/ML IJ SOLN
INTRAMUSCULAR | Status: AC
  Filled 2016-02-24: qty 2

## 2016-02-24 MED ORDER — SOD CITRATE-CITRIC ACID 500-334 MG/5ML PO SOLN: 30.0000 mL | ORAL | Status: DC | PRN

## 2016-02-24 MED ORDER — AMMONIA AROMATIC IN INHA
RESPIRATORY_TRACT | Status: AC
  Filled 2016-02-24: qty 10

## 2016-02-24 MED ORDER — LIDOCAINE HCL (PF) 1 % IJ SOLN
INTRAMUSCULAR | Status: AC
  Administered 2016-02-24: 30 mL via SUBCUTANEOUS
  Filled 2016-02-24: qty 30

## 2016-02-24 MED ORDER — OXYCODONE-ACETAMINOPHEN 5-325 MG PO TABS
2.0000 | ORAL_TABLET | ORAL | Status: DC | PRN
Start: 2016-02-24 — End: 2016-02-26
  Administered 2016-02-25 – 2016-02-26 (×5): 2 via ORAL
  Filled 2016-02-24 (×5): qty 2

## 2016-02-24 NOTE — Progress Notes (Signed)
Labor Note: S: Contractions moderately painful. O: BP 109/64   Pulse 83   Temp 98.8 F (37.1 C) (Oral)   Resp 18   Ht 5\' 1"  (1.549 m)   Wt 195 lb 11.2 oz (88.8 kg)   LMP 05/23/2015 (Approximate)   BMI 36.98 kg/m  Cervix7/80/-1/Bulging BOWI -AROM clear FHR - Cat 1 A: Slow progress P: Amniotomy; Continue GBS prophylaxis.  Herold HarmsMartin A Marcquis Ridlon, MD
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
## 2016-02-24 NOTE — OB Triage Note (Signed)
Pt arrived to OBS rm 3 with c/o contractions starting last night at 2000 every and increasing with intensity and with a small amount of bloody show. Pt placed on monitor, will cont. To monitor.

## 2016-02-24 NOTE — Progress Notes (Signed)
Difficulty accurately assessing contractions on toco due to maternal position and repositioning. Unable to correlate subtle decelerations in FHT's with contractions.  RN to bedside to adjust monitor.  Moderate variability in fetal heart rate remains.

## 2016-02-25 LAB — CBC
HEMATOCRIT: 34.4 % — AB (ref 35.0–47.0)
HEMOGLOBIN: 11.6 g/dL — AB (ref 12.0–16.0)
MCH: 30.1 pg (ref 26.0–34.0)
MCHC: 33.6 g/dL (ref 32.0–36.0)
MCV: 89.6 fL (ref 80.0–100.0)
Platelets: 208 10*3/uL (ref 150–440)
RBC: 3.84 MIL/uL (ref 3.80–5.20)
RDW: 14.3 % (ref 11.5–14.5)
WBC: 11.7 10*3/uL — ABNORMAL HIGH (ref 3.6–11.0)

## 2016-02-25 LAB — RPR: RPR: NONREACTIVE

## 2016-02-25 MED ORDER — TETANUS-DIPHTH-ACELL PERTUSSIS 5-2.5-18.5 LF-MCG/0.5 IM SUSP: 0.5000 mL | INTRAMUSCULAR | Status: DC | PRN

## 2016-02-25 MED ORDER — COCONUT OIL OIL
1.0000 "application " | TOPICAL_OIL | Status: DC | PRN
  Filled 2016-02-25: qty 120

## 2016-02-25 MED ORDER — ACETAMINOPHEN 325 MG PO TABS: 650.0000 mg | ORAL_TABLET | ORAL | Status: DC | PRN

## 2016-02-25 MED ORDER — ACETAMINOPHEN 325 MG PO TABS
650.0000 mg | ORAL_TABLET | Freq: Four times a day (QID) | ORAL | Status: AC
  Administered 2016-02-25: 650 mg via ORAL

## 2016-02-25 MED ORDER — FENTANYL 2.5 MCG/ML W/ROPIVACAINE 0.2% IN NS 100 ML EPIDURAL INFUSION (ARMC-ANES): 10.0000 mL/h | EPIDURAL | Status: DC

## 2016-02-25 MED ORDER — DIPHENHYDRAMINE HCL 50 MG/ML IJ SOLN: 12.5000 mg | INTRAMUSCULAR | Status: DC | PRN

## 2016-02-25 MED ORDER — DOCUSATE SODIUM 100 MG PO CAPS
100.0000 mg | ORAL_CAPSULE | Freq: Two times a day (BID) | ORAL | Status: DC
  Administered 2016-02-25 – 2016-02-26 (×3): 100 mg via ORAL
  Filled 2016-02-25 (×3): qty 1

## 2016-02-25 MED ORDER — FERROUS SULFATE 325 (65 FE) MG PO TABS
325.0000 mg | ORAL_TABLET | Freq: Two times a day (BID) | ORAL | Status: DC
  Administered 2016-02-25 – 2016-02-26 (×3): 325 mg via ORAL
  Filled 2016-02-25 (×3): qty 1

## 2016-02-25 MED ORDER — NALBUPHINE HCL 10 MG/ML IJ SOLN: 5.0000 mg | Freq: Once | INTRAMUSCULAR | Status: DC | PRN

## 2016-02-25 MED ORDER — ONDANSETRON HCL 4 MG/2ML IJ SOLN: 4.0000 mg | Freq: Three times a day (TID) | INTRAMUSCULAR | Status: DC | PRN

## 2016-02-25 MED ORDER — ONDANSETRON HCL 4 MG/2ML IJ SOLN: 4.0000 mg | INTRAMUSCULAR | Status: DC | PRN

## 2016-02-25 MED ORDER — SCOPOLAMINE 1 MG/3DAYS TD PT72: 1.0000 | MEDICATED_PATCH | Freq: Once | TRANSDERMAL | Status: DC

## 2016-02-25 MED ORDER — DIPHENHYDRAMINE HCL 25 MG PO CAPS: 25.0000 mg | ORAL_CAPSULE | ORAL | Status: DC | PRN

## 2016-02-25 MED ORDER — OXYTOCIN 40 UNITS IN LACTATED RINGERS INFUSION - SIMPLE MED: 2.5000 [IU]/h | INTRAVENOUS | Status: DC | PRN

## 2016-02-25 MED ORDER — NALBUPHINE HCL 10 MG/ML IJ SOLN: 5.0000 mg | INTRAMUSCULAR | Status: DC | PRN

## 2016-02-25 MED ORDER — PRENATAL MULTIVITAMIN CH
1.0000 | ORAL_TABLET | Freq: Every day | ORAL | Status: DC
  Administered 2016-02-25 – 2016-02-26 (×2): 1 via ORAL
  Filled 2016-02-25 (×2): qty 1

## 2016-02-25 MED ORDER — NALOXONE HCL 0.4 MG/ML IJ SOLN: 0.4000 mg | INTRAMUSCULAR | Status: DC | PRN

## 2016-02-25 MED ORDER — ONDANSETRON HCL 4 MG PO TABS
4.0000 mg | ORAL_TABLET | ORAL | Status: DC | PRN
  Administered 2016-02-25 – 2016-02-26 (×3): 4 mg via ORAL
  Filled 2016-02-25 (×3): qty 1

## 2016-02-25 MED ORDER — MEASLES, MUMPS & RUBELLA VAC ~~LOC~~ INJ
0.5000 mL | INJECTION | Freq: Once | SUBCUTANEOUS | Status: AC
  Administered 2016-02-26: 0.5 mL via SUBCUTANEOUS
  Filled 2016-02-25: qty 0.5

## 2016-02-25 MED ORDER — NALOXONE HCL 2 MG/2ML IJ SOSY: 1.0000 ug/kg/h | PREFILLED_SYRINGE | INTRAVENOUS | Status: DC | PRN

## 2016-02-25 MED ORDER — SODIUM CHLORIDE 0.9% FLUSH: 3.0000 mL | INTRAVENOUS | Status: DC | PRN

## 2016-02-25 MED ORDER — DIBUCAINE 1 % RE OINT: 1.0000 "application " | TOPICAL_OINTMENT | RECTAL | Status: DC | PRN

## 2016-02-25 MED ORDER — WITCH HAZEL-GLYCERIN EX PADS
1.0000 "application " | MEDICATED_PAD | CUTANEOUS | Status: DC | PRN
  Administered 2016-02-25: 1 via TOPICAL
  Filled 2016-02-25: qty 100

## 2016-02-25 MED ORDER — BENZOCAINE-MENTHOL 20-0.5 % EX AERO
1.0000 "application " | INHALATION_SPRAY | CUTANEOUS | Status: DC | PRN
  Administered 2016-02-25: 1 via TOPICAL
  Filled 2016-02-25: qty 56

## 2016-02-25 NOTE — Progress Notes (Signed)
Post Partum Day 1 Subjective: no complaints, up ad lib, voiding, tolerating PO and breastfeeding  Objective: Blood pressure 98/62, pulse (!) 58, temperature 98.3 F (36.8 C), temperature source Oral, resp. rate 18, height 5\' 1"  (1.549 m), weight 195 lb 11.2 oz (88.8 kg), last menstrual period 05/23/2015, SpO2 99 %, unknown if currently breastfeeding.  Physical Exam:  General: alert and cooperative Lochia: appropriate Uterine Fundus: firm Incision: sore (perineal superficial lacs DVT Evaluation: No evidence of DVT seen on physical exam.   Recent Labs  02/24/16 1621 02/25/16 0518  HGB 12.3 11.6*  HCT 35.4 34.4*    Assessment/Plan: Plan for discharge tomorrow, Breastfeeding and Contraception condoms   LOS: 1 day   Hannah Norris 02/25/2016, 11:57 AM

## 2016-02-25 NOTE — Progress Notes (Signed)
ROB: Patient noting some irregular ctx.  Given labor precautions. RTC in 1 week if undelivered. To discuss IOL at that time for mild polyhydramnios.

## 2016-02-26 MED ORDER — DOCUSATE SODIUM 100 MG PO CAPS: 100.0000 mg | ORAL_CAPSULE | Freq: Every day | ORAL | 1 refills | Status: DC | PRN

## 2016-02-26 MED ORDER — VARICELLA VIRUS VACCINE LIVE 1350 PFU/0.5ML IJ SUSR
0.5000 mL | Freq: Once | INTRAMUSCULAR | Status: AC
  Administered 2016-02-26: 0.5 mL via SUBCUTANEOUS
  Filled 2016-02-26: qty 0.5

## 2016-02-26 MED ORDER — ACETAMINOPHEN 325 MG PO TABS: 650.0000 mg | ORAL_TABLET | Freq: Four times a day (QID) | ORAL | 1 refills | Status: DC | PRN

## 2016-02-26 NOTE — Discharge Instructions (Signed)
General Postpartum Discharge Instructions  Do not drink alcohol or take tranquilizers.  Do not take medicine that has not been prescribed by your doctor.  Take showers instead of baths until your doctor gives you permission to take baths.  No sexual intercourse or placement of anything in the vagina for 6 weeks or as instructed by your doctor. Only take prescription or over-the-counter medicines  for pain, discomfort, or fever as directed by your doctor. Take medicines (antibiotics) that kill germs if they are prescribed for you.   Call the office: You feel sick to your stomach (nauseous).  You start to throw up (vomit).  You have trouble eating or drinking.  You have an oral temperature above 101.  You have constipation that is not helped by adjusting diet or increasing fluid intake. Pain medicines are a common cause of constipation.  You have foul smelling vaginal discharge or odor.  You have bleeding requiring changing more than 1 pad per hour. You have any other concerns.  SEEK IMMEDIATE MEDICAL CARE IF:  You have persistent dizziness.  You have difficulty breathing or shortness of breath.  You have an oral temperature above 102.5, not controlled by medicine.  Leg pain Vaginal bleeding that saturates 2 or more pads per hour.

## 2016-02-26 NOTE — Progress Notes (Signed)
Pt expecting Percocet prescription for discharge. Per patient "when the cramps get really bad the Tylenol doesn't touch it. That's when I need the Percocet."  Requested I call Dr. Valentino Saxonherry to see if she would write it.  Dr. Valentino Saxonherry spoken to via OR nurse. Per Dr. Valentino Saxonherry, it is not her general protocol to give narcotic prescription to vaginal delivery. Advised to tell patient to continue Tylenol and that the cramps would get better in the coming days. Patient given information and is not pleased but accepts. I told patient that if cramps continue to be unbearable and do not get better, to call office.   Hannah ShellerMegan Amirah Goerke, RN

## 2016-02-26 NOTE — Progress Notes (Signed)
Post Partum Day # 2, s/p SVD  Subjective: no complaints, up ad lib, voiding and tolerating PO  Objective: Blood pressure 98/61, pulse 77, temperature 98.5 F (36.9 C), temperature source Oral, resp. rate 18, height 5\' 1"  (1.549 m), weight 195 lb 11.2 oz (88.8 kg), last menstrual period 05/23/2015, SpO2 99 %, unknown if currently breastfeeding.  Physical Exam:  General: alert and no distress  Lungs: clear to auscultation bilaterally Breasts: normal appearance, no masses or tenderness Heart: regular rate and rhythm, S1, S2 normal, no murmur, click, rub or gallop Pelvis: Lochia: appropriate, Uterine Fundus: firm Extremities: DVT Evaluation: No evidence of DVT seen on physical exam.  Negative Homan's sign.  No cords or calf tenderness.  No significant calf/ankle edema.   Recent Labs  02/24/16 1621 02/25/16 0518  HGB 12.3 11.6*  HCT 35.4 34.4*    Assessment/Plan: Discharge home, Breastfeeding and Contraception undecided.  Advised on abstinence until 6 week postpartum visit, or use condoms.    LOS: 2 days   Hildred LaserAnika Cherylin [place] [facility]

## 2016-02-26 NOTE — Progress Notes (Signed)
Patient discharged home with infant. Vital signs stable, bleeding within normal limits, uterus firm. Discharge instructions, prescriptions, and follow up appointment given to and reviewed with patient. Patient verbalized understanding, all questions answered. Escorted in wheelchair by auxiliary.    Rue Valladares, RN  

## 2016-02-26 NOTE — Discharge Summary (Signed)
Obstetric Discharge Summary Reason for Admission: onset of labor Prenatal Procedures: NST and ultrasound Intrapartum Procedures: spontaneous vaginal delivery Postpartum Procedures: Rubella Ig and Varivax Complications-Operative and Postpartum: vaginal laceration (1st degree)   Hemoglobin  Date Value Ref Range Status  02/25/2016 11.6 (L) 12.0 - 16.0 g/dL Final   HCT  Date Value Ref Range Status  02/25/2016 34.4 (L) 35.0 - 47.0 % Final   Hematocrit  Date Value Ref Range Status  12/06/2015 33.0 (L) 34.0 - 46.6 % Final    Physical Exam:  Blood pressure 98/61, pulse 77, temperature 98.5 F (36.9 C), temperature source Oral, resp. rate 18, height 5\' 1"  (1.549 m), weight 195 lb 11.2 oz (88.8 kg), last menstrual period 05/23/2015, SpO2 99 %, unknown if currently breastfeeding.  General: alert and no distress Lochia: appropriate Uterine Fundus: firm Incision: None DVT Evaluation: Negative Homan's sign. No cords or calf tenderness. No significant calf/ankle edema.  Discharge Diagnoses: Term Pregnancy-delivered and Polyhydramnios  Discharge Information: Date: 02/26/2016 Activity: pelvic rest Diet: routine Medications: PNV, Colace and Tylenol Condition: stable Instructions: refer to practice specific booklet Discharge to: home Follow-up Information    Hannah LaserAnika Jaxin Fulfer, MD Follow up in 6 week(s).   Specialties:  Obstetrics and Gynecology, Radiology Why:  Postpartum visit Contact information: [address] [place] [address] [phone]           Newborn Data: Live born female  Birth Weight: 7 lb 4.8 oz (3310 g) APGAR: 9, 9  Home with mother.  Hannah Lasernika Ashelyn Norris 02/26/2016, 9:37 AM

## 2016-02-28 ENCOUNTER — Encounter: Payer: Medicaid Other | Admitting: Obstetrics and Gynecology

## 2016-03-29 ENCOUNTER — Telehealth: Payer: Self-pay | Admitting: Obstetrics and Gynecology

## 2016-03-29 NOTE — Telephone Encounter (Signed)
Called pt she states that she had 5 weeks of postpartum bleeding which ended and now she is having period like bleeding again. Pt is only on day 3 of this bleeding. Advised pt that this is likely her period. Pt denies that the bleeding is heavy or that she is passing clots. Pt states she was not expecting bleeding as she is nursing, advised pt that is is common to still have cycle while lactating. Advised pt to call back if bleeding becomes heavier or lasts greater than 7 days

## 2016-03-29 NOTE — Telephone Encounter (Signed)
PT WILL BE 5 WEEKS POST PARTUM TOMORROW AND SHE HAD STOPPED BLEEDING AND NOW SHE IS BLEEDING AGAIN LIKE A FULL ON PERIOD AND SHE ISNT SURE IF IT IS OR NOT, SHE HAS BEEN BREASTFEEDING THE ENITRE TIME TOO. PT WOULD LIKE A CALL BACK.

## 2016-04-05 NOTE — Progress Notes (Signed)
   OBSTETRICS POSTPARTUM CLINIC PROGRESS NOTE  Subjective:     Hannah Norris is a 25 y.o. 503P3003 female who presents for a postpartum visit. She is 6 weeks postpartum following a spontaneous vaginal delivery. I have fully reviewed the prenatal and intrapartum course. The delivery was at 39 gestational weeks.  Anesthesia: local. Postpartum course has been well. Baby's course has been well. Baby is feeding by breast. Bleeding: patient has not resumed menses, with No LMP recorded.. Bowel function is normal. Bladder function is normal. Patient is not sexually active. Contraception method desired is undecided. Postpartum depression screening: negative, PHQ-9 score 0.  The following portions of the patient's history were reviewed and updated as appropriate: allergies, current medications, past family history, past medical history, past social history, past surgical history and problem list.  Review of Systems A comprehensive review of systems was negative except for: Genitourinary: positive for pain at vaginal laceration site, and vaginal discharge (yellow-brown, itchy)   Objective:    BP 102/71 (BP Location: Left Arm, Patient Position: Sitting, Cuff Size: Large)   Pulse 71   Ht 5\' 1"  (1.549 m)   Wt 171 lb 6.4 oz (77.7 kg)   Breastfeeding? Yes   BMI 32.39 kg/m   General:  alert and no distress   Breasts:  inspection negative, no nipple discharge or bleeding, no masses or nodularity palpable  Lungs: clear to auscultation bilaterally  Heart:  regular rate and rhythm, S1, S2 normal, no murmur, click, rub or gallop  Abdomen: soft, non-tender; bowel sounds normal; no masses,  no organomegaly.    Vulva:  normal  Vagina: normal vagina, no lesion, or erythema.  Vaginal laceration well-healed. Moderate amount of thin yellow discharge with slight odor in vaginal vault.   Cervix:  no cervical motion tenderness and no lesions  Corpus: normal size, contour, position, consistency, mobility, non-tender    Adnexa:  normal adnexa and no mass, fullness, tenderness  Rectal Exam: Not performed.         Microscopic wet-mount exam shows, clue cells, lactobacilli, trichomonads.  KOH done. No yeast noted.    Labs:  Lab Results  Component Value Date   HGB 11.6 (L) 02/25/2016    Assessment:   Routine postpartum exam.    Contraception counseling Lactational amenorrhea Bacterial vaginosis Trichomonas infection  Plan:   1. Contraception: Reviewed all forms of birth control options available including abstinence; over the counter/barrier methods; hormonal contraceptive medication including pill, patch, ring, injection,contraceptive implant; hormonal and nonhormonal IUDs; permanent sterilization options including vasectomy and the various tubal sterilization modalities. Risks and benefits reviewed.  Questions were answered.  Patient desires to do mini-pill for now.  Advised on Sunday start.  2. Bacterial vaginosis, will treat with PO Flagyl. Discussed proper hygiene and care regimen.  3. Trichomoniasis infection - discussed STI, safe sex practices.  Also encouraged partner testing and treatment. Patient notes that she has not been sexually active since prior to recent delivery. Discussed that it may have been present prior to delivery. Will need to return in 4-6 weeks for TOC.  3. Follow up in: 3-6 months for annual exam, or sooner as needed.    Hildred LaserAnika Trestin Vences, MD [facility]

## 2016-04-10 ENCOUNTER — Encounter: Payer: Self-pay | Admitting: Obstetrics and Gynecology

## 2016-04-10 ENCOUNTER — Ambulatory Visit (INDEPENDENT_AMBULATORY_CARE_PROVIDER_SITE_OTHER): Payer: Medicaid Other | Admitting: Obstetrics and Gynecology

## 2016-04-10 DIAGNOSIS — A599 Trichomoniasis, unspecified: Secondary | ICD-10-CM

## 2016-04-10 DIAGNOSIS — N912 Amenorrhea, unspecified: Secondary | ICD-10-CM

## 2016-04-10 DIAGNOSIS — N760 Acute vaginitis: Secondary | ICD-10-CM

## 2016-04-10 DIAGNOSIS — B9689 Other specified bacterial agents as the cause of diseases classified elsewhere: Secondary | ICD-10-CM

## 2016-04-10 DIAGNOSIS — Z3009 Encounter for other general counseling and advice on contraception: Secondary | ICD-10-CM

## 2016-04-10 MED ORDER — NORETHINDRONE 0.35 MG PO TABS: 1.0000 | ORAL_TABLET | Freq: Every day | ORAL | 11 refills | Status: DC

## 2016-04-10 MED ORDER — METRONIDAZOLE 500 MG PO TABS: 500.0000 mg | ORAL_TABLET | Freq: Two times a day (BID) | ORAL | 0 refills | Status: DC

## 2016-04-10 NOTE — Patient Instructions (Addendum)
Bacterial Vaginosis Bacterial vaginosis is a vaginal infection that occurs when the normal balance of bacteria in the vagina is disrupted. It results from an overgrowth of certain bacteria. This is the most common vaginal infection among women ages 15-44. Because bacterial vaginosis increases your risk for STIs (sexually transmitted infections), getting treated can help reduce your risk for chlamydia, gonorrhea, herpes, and HIV (human immunodeficiency virus). Treatment is also important for preventing complications in pregnant women, because this condition can cause an early (premature) delivery. What are the causes? This condition is caused by an increase in harmful bacteria that are normally present in small amounts in the vagina. However, the reason that the condition develops is not fully understood. What increases the risk? The following factors may make you more likely to develop this condition:  Having a new sexual partner or multiple sexual partners.  Having unprotected sex.  Douching.  Having an intrauterine device (IUD).  Smoking.  Drug and alcohol abuse.  Taking certain antibiotic medicines.  Being pregnant.  You cannot get bacterial vaginosis from toilet seats, bedding, swimming pools, or contact with objects around you. What are the signs or symptoms? Symptoms of this condition include:  Grey or white vaginal discharge. The discharge can also be watery or foamy.  A fish-like odor with discharge, especially after sexual intercourse or during menstruation.  Itching in and around the vagina.  Burning or pain with urination.  Some women with bacterial vaginosis have no signs or symptoms. How is this diagnosed? This condition is diagnosed based on:  Your medical history.  A physical exam of the vagina.  Testing a sample of vaginal fluid under a microscope to look for a large amount of bad bacteria or abnormal cells. Your health care provider may use a cotton swab  or a small wooden spatula to collect the sample.  How is this treated? This condition is treated with antibiotics. These may be given as a pill, a vaginal cream, or a medicine that is put into the vagina (suppository). If the condition comes back after treatment, a second round of antibiotics may be needed. Follow these instructions at home: Medicines  Take over-the-counter and prescription medicines only as told by your health care provider.  Take or use your antibiotic as told by your health care provider. Do not stop taking or using the antibiotic even if you start to feel better. General instructions  If you have a female sexual partner, tell her that you have a vaginal infection. She should see her health care provider and be treated if she has symptoms. If you have a female sexual partner, he does not need treatment.  During treatment: ? Avoid sexual activity until you finish treatment. ? Do not douche. ? Avoid alcohol as directed by your health care provider. ? Avoid breastfeeding as directed by your health care provider.  Drink enough water and fluids to keep your urine clear or pale yellow.  Keep the area around your vagina and rectum clean. ? Wash the area daily with warm water. ? Wipe yourself from front to back after using the toilet.  Keep all follow-up visits as told by your health care provider. This is important. How is this prevented?  Do not douche.  Wash the outside of your vagina with warm water only.  Use protection when having sex. This includes latex condoms and dental dams.  Limit how many sexual partners you have. To help prevent bacterial vaginosis, it is best to have sex with just   one partner (monogamous).  Make sure you and your sexual partner are tested for STIs.  Wear cotton or cotton-lined underwear.  Avoid wearing tight pants and pantyhose, especially during summer.  Limit the amount of alcohol that you drink.  Do not use any products that  contain nicotine or tobacco, such as cigarettes and e-cigarettes. If you need help quitting, ask your health care provider.  Do not use illegal drugs. Where to find more information:  Centers for Disease Control and Prevention: www.cdc.gov/std  American Sexual Health Association (ASHA): www.ashastd.org  [place] Department of Health and Human Services, Office on Women's Health: www.womenshealth.gov/ or https://www.womenshealth.gov/a-z-topics/bacterial-vaginosis Contact a health care provider if:  Your symptoms do not improve, even after treatment.  You have more discharge or pain when urinating.  You have a fever.  You have pain in your abdomen.  You have pain during sex.  You have vaginal bleeding between periods. Summary  Bacterial vaginosis is a vaginal infection that occurs when the normal balance of bacteria in the vagina is disrupted.  Because bacterial vaginosis increases your risk for STIs (sexually transmitted infections), getting treated can help reduce your risk for chlamydia, gonorrhea, herpes, and HIV (human immunodeficiency virus). Treatment is also important for preventing complications in pregnant women, because the condition can cause an early (premature) delivery.  This condition is treated with antibiotic medicines. These may be given as a pill, a vaginal cream, or a medicine that is put into the vagina (suppository). This information is not intended to replace advice given to you by your health care provider. Make sure you discuss any questions you have with your health care provider. Document Released: 04/01/2005 Document Revised: 12/16/2015 Document Reviewed: 12/16/2015 Elsevier Interactive Patient Education  2017 [employer]  

## 2016-05-14 ENCOUNTER — Encounter: Payer: Medicaid Other | Admitting: Obstetrics and Gynecology

## 2017-03-26 ENCOUNTER — Encounter: Payer: Self-pay | Admitting: Obstetrics and Gynecology

## 2017-04-15 NOTE — L&D Delivery Note (Signed)
       Delivery Note   Hannah Norris is a 27 y.o. G4P3003 at 9065w5d Estimated Date of Delivery: 02/25/18  PRE-OPERATIVE DIAGNOSIS:  1) 2665w5d pregnancy.   POST-OPERATIVE DIAGNOSIS:  1) 565w5d pregnancy s/p Vaginal, Spontaneous    Delivery Type: Vaginal, Spontaneous    Delivery Anesthesia: None   Labor Complications:      ESTIMATED BLOOD LOSS: 75  ml    FINDINGS:   1) female infant, Apgar scores of     at 1 minute and     at 5 minutes and a birthweight of    ounces.    2) Nuchal cord: No  SPECIMENS:   PLACENTA:   Appearance: Intact    Removal: Spontaneous      Disposition:     DISPOSITION:  Infant to left in stable condition in the delivery room, with L&D personnel and mother,  NARRATIVE SUMMARY: Labor course:  [name] is a [id number] at 3065w5d who presented for induction of labor.  She progressed well in labor with pitocin.   She evidenced good maternal expulsive effort during the second stage. She went on to deliver a viable infant. The placenta delivered without problems and was noted to be complete. A perineal and vaginal examination was performed. Episiotomy/Lacerations: None   Elonda Huskyavid J. Evans, M.D. 03/02/2018 8:09 PM

## 2017-06-25 ENCOUNTER — Ambulatory Visit (INDEPENDENT_AMBULATORY_CARE_PROVIDER_SITE_OTHER): Payer: Medicaid Other | Admitting: Obstetrics and Gynecology

## 2017-06-25 ENCOUNTER — Encounter: Payer: Self-pay | Admitting: Obstetrics and Gynecology

## 2017-06-25 VITALS — BP 107/41 | HR 91 | Ht 61.0 in | Wt 167.2 lb

## 2017-06-25 DIAGNOSIS — A599 Trichomoniasis, unspecified: Secondary | ICD-10-CM

## 2017-06-25 DIAGNOSIS — Z3201 Encounter for pregnancy test, result positive: Secondary | ICD-10-CM | POA: Diagnosis not present

## 2017-06-25 LAB — POCT URINE PREGNANCY: Preg Test, Ur: POSITIVE — AB

## 2017-06-25 MED ORDER — METRONIDAZOLE 500 MG PO TABS: 2000.0000 mg | ORAL_TABLET | Freq: Once | ORAL | 1 refills | Status: AC

## 2017-06-25 NOTE — Progress Notes (Signed)
Pt noticed a lot of discharge. When walking can feel it coming out. A little itching in the vaginal area w/o odor. LMP 05/21/17 took home preg test positive

## 2017-06-25 NOTE — Progress Notes (Signed)
    GYNECOLOGY PROGRESS NOTE  Subjective:    Patient ID: Anne Hahndriana Widener, female    DOB: 12/01/1990, 27 y.o.   MRN: [id number]  HPI  Patient is a 27 y.o. 733P3003 female who presents for complaints of heavy thin vaginal discharge (no odor) x 2 weeks. Notices when walking that it feels like it is constantly leaking.  Denies itching/burning.  Notes that she was seen ~ 5 months ago with her PCP for a similar complaint.  Was treated with Flagyl x 7 days for trichomonas infection. Notes partner was treated too.   In addition, patient reports abdominal cramping off and on for the past week.Rochele Pages. Took a home pregnancy test several days ago with positive result.  Patient notes that she has not been on birth control for at least the past 6 months. Has still been intermittently breastfeeding her 1315 month old child.  Pregnancy was unplanned, but is desired. Patient's last menstrual period was 05/21/2017.    The following portions of the patient's history were reviewed and updated as appropriate: allergies, current medications, past family history, past medical history, past social history, past surgical history and problem list.  Review of Systems Pertinent items noted in HPI and remainder of comprehensive ROS otherwise negative.   Objective:   Blood pressure (!) 107/41, pulse 91, height 5\' 1"  (1.549 m), weight 167 lb 3.2 oz (75.8 kg), last menstrual period 05/21/2017, currently breastfeeding. General appearance: alert and no distress Abdomen: soft, non-tender; bowel sounds normal; no masses,  no organomegaly Pelvic: external genitalia normal, rectovaginal septum normal.  Vagina with moderate amount of frothy discharge.  Cervix normal appearing, no lesions and no motion tenderness.  Uterus mobile, nontender, normal shape and size.  Adnexae non-palpable, nontender bilaterally.  Extremities: extremities normal, atraumatic, no cyanosis or edema Neurologic: Grossly normal   Microscopic wet-mount exam shows few  clue cells, no yeast.  Trichomonads present. KOH done.    Assessment:   Trichomoniasis Pregnancy test positive  Plan:    1. Trichomoniasis - will prescribe Flagyl 2 grams x 1 dose, and will refill (can use for partner treatment). Advised to refrain from intercourse 7 days until post-treatment.  Discussed safe sex practices.  Will need TOC at NOB visit.  2. Pregnancy test positive. Pregnancy unplanned but desired. Has initiated PNV.  Currently EGA 5.0 weeks, with EDD 02/25/2018.  Patient will f/u for NOB intake and ultrasound in 4 weeks for dating/viability.    Hildred Laserherry, Saliou Barnier, MD [facility]

## 2017-06-25 NOTE — Patient Instructions (Addendum)
Trichomoniasis °Trichomoniasis is an STI (sexually transmitted infection) that can affect both women and men. In women, the outer area of the female genitalia (vulva) and the vagina are affected. In men, the penis is mainly affected, but the prostate and other reproductive organs can also be involved. This condition can be treated with medicine. It often has no symptoms (is asymptomatic), especially in men. °What are the causes? °This condition is caused by an organism called Trichomonas vaginalis. Trichomoniasis most often spreads from person to person (is contagious) through sexual contact. °What increases the risk? °The following factors may make you more likely to develop this condition: °· Having unprotected sexual intercourse. °· Having sexual intercourse with a partner who has trichomoniasis. °· Having multiple sexual partners. °· Having had previous trichomoniasis infections or other STIs. ° °What are the signs or symptoms? °In women, symptoms of trichomoniasis include: °· Abnormal vaginal discharge that is clear, white, gray, or yellow-green and foamy and has an unusual "fishy" odor. °· Itching and irritation of the vagina and vulva. °· Burning or pain during urination or sexual intercourse. °· Genital redness and swelling. ° °In men, symptoms of trichomoniasis include: °· Penile discharge that may be foamy or contain pus. °· Pain in the penis. This may happen only when urinating. °· Itching or irritation inside the penis. °· Burning after urination or ejaculation. ° °How is this diagnosed? °In women, this condition may be found during a routine Pap test or physical exam. It may be found in men during a routine physical exam. Your health care provider may perform tests to help diagnose this infection, such as: °· Urine tests (men and women). °· The following in women: °? Testing the pH of the vagina. °? A vaginal swab test that checks for the Trichomonas vaginalis organism. °? Testing vaginal  secretions. ° °Your health care provider may test you for other STIs, including HIV (human immunodeficiency virus). °How is this treated? °This condition is treated with medicine taken by mouth (orally), such as metronidazole or tinidazole to fight the infection. Your sexual partner(s) may also need to be tested and treated. °· If you are a woman and you plan to become pregnant or think you may be pregnant, tell your health care provider right away. Some medicines that are used to treat the infection should not be taken during pregnancy. ° °Your health care provider may recommend over-the-counter medicines or creams to help relieve itching or irritation. You may be tested for infection again 3 months after treatment. °Follow these instructions at home: °· Take and use over-the-counter and prescription medicines, including creams, only as told by your health care provider. °· Do not have sexual intercourse until one week after you finish your medicine, or until your health care provider approves. Ask your health care provider when you may resume sexual intercourse. °· (Women) Do not douche or wear tampons while you have the infection. °· Discuss your infection with your sexual partner(s). Make sure that your partner gets tested and treated, if necessary. °· Keep all follow-up visits as told by your health care provider. This is important. °How is this prevented? °· Use condoms every time you have sex. Using condoms correctly and consistently can help protect against STIs. °· Avoid having multiple sexual partners. °· Talk with your sexual partner about any symptoms that either of you may have, as well as any history of STIs. °· Get tested for STIs and STDs (sexually transmitted diseases) before you have sex. Ask your partner   to do the same. °· Do not have sexual contact if you have symptoms of trichomoniasis or another STI. °Contact a health care provider if: °· You still have symptoms after you finish your  medicine. °· You develop pain in your abdomen. °· You have pain when you urinate. °· You have bleeding after sexual intercourse. °· You develop a rash. °· You feel nauseous or you vomit. °· You plan to become pregnant or think you may be pregnant. °Summary °· Trichomoniasis is an STI (sexually transmitted infection) that can affect both women and men. °· This condition often has no symptoms (is asymptomatic), especially in men. °· You should not have sexual intercourse until one week after you finish your medicine, or until your health care provider approves. Ask your health care provider when you may resume sexual intercourse. °· Discuss your infection with your sexual partner. Make sure that your partner gets tested and treated, if necessary. °This information is not intended to replace advice given to you by your health care provider. Make sure you discuss any questions you have with your health care provider. °Document Released: 09/25/2000 Document Revised: 02/23/2016 Document Reviewed: 02/23/2016 °Elsevier Interactive Patient Education © 2017 [employer] °First Trimester of Pregnancy °The first trimester of pregnancy is from week 1 until the end of week 13 (months 1 through 3). A week after a sperm fertilizes an egg, the egg will implant on the wall of the uterus. This embryo will begin to develop into a baby. Genes from you and your partner will form the baby. The female genes will determine whether the baby will be a boy or a girl. At 6-8 weeks, the eyes and face will be formed, and the heartbeat can be seen on ultrasound. At the end of 12 weeks, all the baby's organs will be formed. °Now that you are pregnant, you will want to do everything you can to have a healthy baby. Two of the most important things are to get good prenatal care and to follow your health care provider's instructions. Prenatal care is all the medical care you receive before the baby's birth. This care will help prevent, find, and treat  any problems during the pregnancy and childbirth. °Body changes during your first trimester °Your body goes through many changes during pregnancy. The changes vary from woman to woman. °· You may gain or lose a couple of pounds at first. °· You may feel sick to your stomach (nauseous) and you may throw up (vomit). If the vomiting is uncontrollable, call your health care provider. °· You may tire easily. °· You may develop headaches that can be relieved by medicines. All medicines should be approved by your health care provider. °· You may urinate more often. Painful urination may mean you have a bladder infection. °· You may develop heartburn as a result of your pregnancy. °· You may develop constipation because certain hormones are causing the muscles that push stool through your intestines to slow down. °· You may develop hemorrhoids or swollen veins (varicose veins). °· Your breasts may begin to grow larger and become tender. Your nipples may stick out more, and the tissue that surrounds them (areola) may become darker. °· Your gums may bleed and may be sensitive to brushing and flossing. °· Dark spots or blotches (chloasma, mask of pregnancy) may develop on your face. This will likely fade after the baby is born. °· Your menstrual periods will stop. °· You may have a loss of appetite. °· You   may develop cravings for certain kinds of food. °· You may have changes in your emotions from day to day, such as being excited to be pregnant or being concerned that something may go wrong with the pregnancy and baby. °· You may have more vivid and strange dreams. °· You may have changes in your hair. These can include thickening of your hair, rapid growth, and changes in texture. Some women also have hair loss during or after pregnancy, or hair that feels dry or thin. Your hair will most likely return to normal after your baby is born. ° °What to expect at prenatal visits °During a routine prenatal visit: °· You will be  weighed to make sure you and the baby are growing normally. °· Your blood pressure will be taken. °· Your abdomen will be measured to track your baby's growth. °· The fetal heartbeat will be listened to between weeks 10 and 14 of your pregnancy. °· Test results from any previous visits will be discussed. ° °Your health care provider may ask you: °· How you are feeling. °· If you are feeling the baby move. °· If you have had any abnormal symptoms, such as leaking fluid, bleeding, severe headaches, or abdominal cramping. °· If you are using any tobacco products, including cigarettes, chewing tobacco, and electronic cigarettes. °· If you have any questions. ° °Other tests that may be performed during your first trimester include: °· Blood tests to find your blood type and to check for the presence of any previous infections. The tests will also be used to check for low iron levels (anemia) and protein on red blood cells (Rh antibodies). Depending on your risk factors, or if you previously had diabetes during pregnancy, you may have tests to check for high blood sugar that affects pregnant women (gestational diabetes). °· Urine tests to check for infections, diabetes, or protein in the urine. °· An ultrasound to confirm the proper growth and development of the baby. °· Fetal screens for spinal cord problems (spina bifida) and Down syndrome. °· HIV (human immunodeficiency virus) testing. Routine prenatal testing includes screening for HIV, unless you choose not to have this test. °· You may need other tests to make sure you and the baby are doing well. ° °Follow these instructions at home: °Medicines °· Follow your health care provider's instructions regarding medicine use. Specific medicines may be either safe or unsafe to take during pregnancy. °· Take a prenatal vitamin that contains at least 600 micrograms (mcg) of folic acid. °· If you develop constipation, try taking a stool softener if your health care provider  approves. °Eating and drinking °· Eat a balanced diet that includes fresh fruits and vegetables, whole grains, good sources of protein such as meat, eggs, or tofu, and low-fat dairy. Your health care provider will help you determine the amount of weight gain that is right for you. °· Avoid raw meat and uncooked cheese. These carry germs that can cause birth defects in the baby. °· Eating four or five small meals rather than three large meals a day may help relieve nausea and vomiting. If you start to feel nauseous, eating a few soda crackers can be helpful. Drinking liquids between meals, instead of during meals, also seems to help ease nausea and vomiting. °· Limit foods that are high in fat and processed sugars, such as fried and sweet foods. °· To prevent constipation: °? Eat foods that are high in fiber, such as fresh fruits and vegetables, whole grains,   and beans. °? Drink enough fluid to keep your urine clear or pale yellow. °Activity °· Exercise only as directed by your health care provider. Most women can continue their usual exercise routine during pregnancy. Try to exercise for 30 minutes at least 5 days a week. Exercising will help you: °? Control your weight. °? Stay in shape. °? Be prepared for labor and delivery. °· Experiencing pain or cramping in the lower abdomen or lower back is a good sign that you should stop exercising. Check with your health care provider before continuing with normal exercises. °· Try to avoid standing for long periods of time. Move your legs often if you must stand in one place for a long time. °· Avoid heavy lifting. °· Wear low-heeled shoes and practice good posture. °· You may continue to have sex unless your health care provider tells you not to. °Relieving pain and discomfort °· Wear a good support bra to relieve breast tenderness. °· Take warm sitz baths to soothe any pain or discomfort caused by hemorrhoids. Use hemorrhoid cream if your health care provider  approves. °· Rest with your legs elevated if you have leg cramps or low back pain. °· If you develop varicose veins in your legs, wear support hose. Elevate your feet for 15 minutes, 3-4 times a day. Limit salt in your diet. °Prenatal care °· Schedule your prenatal visits by the twelfth week of pregnancy. They are usually scheduled monthly at first, then more often in the last 2 months before delivery. °· Write down your questions. Take them to your prenatal visits. °· Keep all your prenatal visits as told by your health care provider. This is important. °Safety °· Wear your seat belt at all times when driving. °· Make a list of emergency phone numbers, including numbers for family, friends, the hospital, and police and fire departments. °General instructions °· Ask your health care provider for a referral to a local prenatal education class. Begin classes no later than the beginning of month 6 of your pregnancy. °· Ask for help if you have counseling or nutritional needs during pregnancy. Your health care provider can offer advice or refer you to specialists for help with various needs. °· Do not use hot tubs, steam rooms, or saunas. °· Do not douche or use tampons or scented sanitary pads. °· Do not cross your legs for long periods of time. °· Avoid cat litter boxes and soil used by cats. These carry germs that can cause birth defects in the baby and possibly loss of the fetus by miscarriage or stillbirth. °· Avoid all smoking, herbs, alcohol, and medicines not prescribed by your health care provider. Chemicals in these products affect the formation and growth of the baby. °· Do not use any products that contain nicotine or tobacco, such as cigarettes and e-cigarettes. If you need help quitting, ask your health care provider. You may receive counseling support and other resources to help you quit. °· Schedule a dentist appointment. At home, brush your teeth with a soft toothbrush and be gentle when you  floss. °Contact a health care provider if: °· You have dizziness. °· You have mild pelvic cramps, pelvic pressure, or nagging pain in the abdominal area. °· You have persistent nausea, vomiting, or diarrhea. °· You have a bad smelling vaginal discharge. °· You have pain when you urinate. °· You notice increased swelling in your face, hands, legs, or ankles. °· You are exposed to fifth disease or chickenpox. °· You are   exposed to German measles (rubella) and have never had it. °Get help right away if: °· You have a fever. °· You are leaking fluid from your vagina. °· You have spotting or bleeding from your vagina. °· You have severe abdominal cramping or pain. °· You have rapid weight gain or loss. °· You vomit blood or material that looks like coffee grounds. °· You develop a severe headache. °· You have shortness of breath. °· You have any kind of trauma, such as from a fall or a car accident. °Summary °· The first trimester of pregnancy is from week 1 until the end of week 13 (months 1 through 3). °· Your body goes through many changes during pregnancy. The changes vary from woman to woman. °· You will have routine prenatal visits. During those visits, your health care provider will examine you, discuss any test results you may have, and talk with you about how you are feeling. °This information is not intended to replace advice given to you by your health care provider. Make sure you discuss any questions you have with your health care provider. °Document Released: 03/26/2001 Document Revised: 03/13/2016 Document Reviewed: 03/13/2016 °Elsevier Interactive Patient Education © 2018 [employer] ° °

## 2017-06-28 LAB — NUSWAB VAGINITIS PLUS (VG+)
Candida albicans, NAA: NEGATIVE
Candida glabrata, NAA: NEGATIVE
Chlamydia trachomatis, NAA: NEGATIVE
Neisseria gonorrhoeae, NAA: NEGATIVE
Trich vag by NAA: POSITIVE — AB

## 2017-07-08 ENCOUNTER — Other Ambulatory Visit: Payer: Self-pay | Admitting: Obstetrics and Gynecology

## 2017-07-08 DIAGNOSIS — N926 Irregular menstruation, unspecified: Secondary | ICD-10-CM

## 2017-07-15 ENCOUNTER — Encounter: Payer: Medicaid Other | Admitting: Obstetrics and Gynecology

## 2017-07-21 ENCOUNTER — Ambulatory Visit (INDEPENDENT_AMBULATORY_CARE_PROVIDER_SITE_OTHER): Payer: Medicaid Other

## 2017-07-21 ENCOUNTER — Ambulatory Visit (INDEPENDENT_AMBULATORY_CARE_PROVIDER_SITE_OTHER): Payer: Medicaid Other | Admitting: Obstetrics and Gynecology

## 2017-07-21 ENCOUNTER — Other Ambulatory Visit: Payer: Self-pay | Admitting: Obstetrics and Gynecology

## 2017-07-21 VITALS — BP 97/60 | HR 91 | Ht 61.0 in | Wt 173.4 lb

## 2017-07-21 DIAGNOSIS — N926 Irregular menstruation, unspecified: Secondary | ICD-10-CM

## 2017-07-21 LAB — OB RESULTS CONSOLE VARICELLA ZOSTER ANTIBODY, IGG: VARICELLA IGG: IMMUNE

## 2017-07-21 NOTE — Patient Instructions (Signed)
First Trimester of Pregnancy The first trimester of pregnancy is from week 1 until the end of week 13 (months 1 through 3). During this time, your baby will begin to develop inside you. At 6-8 weeks, the eyes and face are formed, and the heartbeat can be seen on ultrasound. At the end of 12 weeks, all the baby's organs are formed. Prenatal care is all the medical care you receive before the birth of your baby. Make sure you get good prenatal care and follow all of your doctor's instructions. Follow these instructions at home: Medicines  Take over-the-counter and prescription medicines only as told by your doctor. Some medicines are safe and some medicines are not safe during pregnancy.  Take a prenatal vitamin that contains at least 600 micrograms (mcg) of folic acid.  If you have trouble pooping (constipation), take medicine that will make your stool soft (stool softener) if your doctor approves. Eating and drinking  Eat regular, healthy meals.  Your doctor will tell you the amount of weight gain that is right for you.  Avoid raw meat and uncooked cheese.  If you feel sick to your stomach (nauseous) or throw up (vomit): ? Eat 4 or 5 small meals a day instead of 3 large meals. ? Try eating a few soda crackers. ? Drink liquids between meals instead of during meals.  To prevent constipation: ? Eat foods that are high in fiber, like fresh fruits and vegetables, whole grains, and beans. ? Drink enough fluids to keep your pee (urine) clear or pale yellow. Activity  Exercise only as told by your doctor. Stop exercising if you have cramps or pain in your lower belly (abdomen) or low back.  Do not exercise if it is too hot, too humid, or if you are in a place of great height (high altitude).  Try to avoid standing for long periods of time. Move your legs often if you must stand in one place for a long time.  Avoid heavy lifting.  Wear low-heeled shoes. Sit and stand up straight.  You  can have sex unless your doctor tells you not to. Relieving pain and discomfort  Wear a good support bra if your breasts are sore.  Take warm water baths (sitz baths) to soothe pain or discomfort caused by hemorrhoids. Use hemorrhoid cream if your doctor says it is okay.  Rest with your legs raised if you have leg cramps or low back pain.  If you have puffy, bulging veins (varicose veins) in your legs: ? Wear support hose or compression stockings as told by your doctor. ? Raise (elevate) your feet for 15 minutes, 3-4 times a day. ? Limit salt in your food. Prenatal care  Schedule your prenatal visits by the twelfth week of pregnancy.  Write down your questions. Take them to your prenatal visits.  Keep all your prenatal visits as told by your doctor. This is important. Safety  Wear your seat belt at all times when driving.  Make a list of emergency phone numbers. The list should include numbers for family, friends, the hospital, and police and fire departments. General instructions  Ask your doctor for a referral to a local prenatal class. Begin classes no later than at the start of month 6 of your pregnancy.  Ask for help if you need counseling or if you need help with nutrition. Your doctor can give you advice or tell you where to go for help.  Do not use hot tubs, steam rooms, or   saunas.  Do not douche or use tampons or scented sanitary pads.  Do not cross your legs for long periods of time.  Avoid all herbs and alcohol. Avoid drugs that are not approved by your doctor.  Do not use any tobacco products, including cigarettes, chewing tobacco, and electronic cigarettes. If you need help quitting, ask your doctor. You may get counseling or other support to help you quit.  Avoid cat litter boxes and soil used by cats. These carry germs that can cause birth defects in the baby and can cause a loss of your baby (miscarriage) or stillbirth.  Visit your dentist. At home, brush  your teeth with a soft toothbrush. Be gentle when you floss. Contact a doctor if:  You are dizzy.  You have mild cramps or pressure in your lower belly.  You have a nagging pain in your belly area.  You continue to feel sick to your stomach, you throw up, or you have watery poop (diarrhea).  You have a bad smelling fluid coming from your vagina.  You have pain when you pee (urinate).  You have increased puffiness (swelling) in your face, hands, legs, or ankles. Get help right away if:  You have a fever.  You are leaking fluid from your vagina.  You have spotting or bleeding from your vagina.  You have very bad belly cramping or pain.  You gain or lose weight rapidly.  You throw up blood. It may look like coffee grounds.  You are around people who have German measles, fifth disease, or chickenpox.  You have a very bad headache.  You have shortness of breath.  You have any kind of trauma, such as from a fall or a car accident. Summary  The first trimester of pregnancy is from week 1 until the end of week 13 (months 1 through 3).  To take care of yourself and your unborn baby, you will need to eat healthy meals, take medicines only if your doctor tells you to do so, and do activities that are safe for you and your baby.  Keep all follow-up visits as told by your doctor. This is important as your doctor will have to ensure that your baby is healthy and growing well. This information is not intended to replace advice given to you by your health care provider. Make sure you discuss any questions you have with your health care provider. Document Released: 09/18/2007 Document Revised: 04/09/2016 Document Reviewed: 04/09/2016 Elsevier Interactive Patient Education  2017 [employer]  

## 2017-07-21 NOTE — Progress Notes (Signed)
Hannah HahnAdriana Norris presents for NOB nurse interview visit. [facility]  G-4 .  P-  3  . Pregnancy education material explained and given. __No_ cats in the home. NOB labs ordered. (TSH/HbgA1c due to Increased BMI), (sickle cell). HIV labs and Drug screen were explained optional and she did not decline. Drug screen ordered PNV encouraged. Genetic screening options discussed. Genetic testing: Ordered/Declined  Pt may discuss with provider. Pt. To follow up with provider the week of April 29-May 3rd

## 2017-07-21 NOTE — Progress Notes (Signed)
I have reviewed the record and concur with patient management and plan.  Iniko Robles, MD [facility]     

## 2017-07-22 LAB — CBC WITH DIFFERENTIAL/PLATELET
BASOS: 0 %
Basophils Absolute: 0 10*3/uL (ref 0.0–0.2)
EOS (ABSOLUTE): 0.1 10*3/uL (ref 0.0–0.4)
EOS: 1 %
HEMATOCRIT: 37 % (ref 34.0–46.6)
Hemoglobin: 12 g/dL (ref 11.1–15.9)
IMMATURE GRANS (ABS): 0 10*3/uL (ref 0.0–0.1)
IMMATURE GRANULOCYTES: 0 %
LYMPHS: 45 %
Lymphocytes Absolute: 2.5 10*3/uL (ref 0.7–3.1)
MCH: 28.1 pg (ref 26.6–33.0)
MCHC: 32.4 g/dL (ref 31.5–35.7)
MCV: 87 fL (ref 79–97)
MONOCYTES: 7 %
Monocytes Absolute: 0.4 10*3/uL (ref 0.1–0.9)
NEUTROS PCT: 47 %
Neutrophils Absolute: 2.6 10*3/uL (ref 1.4–7.0)
PLATELETS: 308 10*3/uL (ref 150–379)
RBC: 4.27 x10E6/uL (ref 3.77–5.28)
RDW: 14.2 % (ref 12.3–15.4)
WBC: 5.6 10*3/uL (ref 3.4–10.8)

## 2017-07-22 LAB — HEMOGLOBIN A1C
ESTIMATED AVERAGE GLUCOSE: 88 mg/dL
Hgb A1c MFr Bld: 4.7 % — ABNORMAL LOW (ref 4.8–5.6)

## 2017-07-22 LAB — URINALYSIS, ROUTINE W REFLEX MICROSCOPIC
BILIRUBIN UA: NEGATIVE
Glucose, UA: NEGATIVE
LEUKOCYTES UA: NEGATIVE
Nitrite, UA: NEGATIVE
Protein, UA: NEGATIVE
RBC UA: NEGATIVE
UUROB: 0.2 mg/dL (ref 0.2–1.0)
pH, UA: 5.5 (ref 5.0–7.5)

## 2017-07-22 LAB — MONITOR DRUG PROFILE 14(MW)
AMPHETAMINE SCREEN URINE: NEGATIVE ng/mL
BARBITURATE SCREEN URINE: NEGATIVE ng/mL
BENZODIAZEPINE SCREEN, URINE: NEGATIVE ng/mL
Buprenorphine, Urine: NEGATIVE ng/mL
CANNABINOIDS UR QL SCN: NEGATIVE ng/mL
COCAINE(METAB.)SCREEN, URINE: NEGATIVE ng/mL
Creatinine(Crt), U: 281.2 mg/dL (ref 20.0–300.0)
Fentanyl, Urine: NEGATIVE pg/mL
MEPERIDINE SCREEN, URINE: NEGATIVE ng/mL
Methadone Screen, Urine: NEGATIVE ng/mL
OXYCODONE+OXYMORPHONE UR QL SCN: NEGATIVE ng/mL
Opiate Scrn, Ur: NEGATIVE ng/mL
PROPOXYPHENE SCREEN URINE: NEGATIVE ng/mL
Ph of Urine: 5.7 (ref 4.5–8.9)
Phencyclidine Qn, Ur: NEGATIVE ng/mL
SPECIFIC GRAVITY: 1.029
TRAMADOL SCREEN, URINE: NEGATIVE ng/mL

## 2017-07-22 LAB — ANTIBODY SCREEN: Antibody Screen: NEGATIVE

## 2017-07-22 LAB — TSH: TSH: 0.945 u[IU]/mL (ref 0.450–4.500)

## 2017-07-22 LAB — ABO AND RH: Rh Factor: POSITIVE

## 2017-07-22 LAB — RUBELLA SCREEN: Rubella Antibodies, IGG: 2.09 index (ref >0.99)

## 2017-07-22 LAB — RPR: RPR: NONREACTIVE

## 2017-07-22 LAB — VARICELLA ZOSTER ANTIBODY, IGG: Varicella zoster IgG: 909 index (ref >165)

## 2017-07-22 LAB — HEPATITIS B SURFACE ANTIGEN: Hepatitis B Surface Ag: NEGATIVE

## 2017-07-22 LAB — SICKLE CELL SCREEN: Sickle Cell Screen: NEGATIVE

## 2017-07-22 LAB — HIV ANTIBODY (ROUTINE TESTING W REFLEX): HIV SCREEN 4TH GENERATION: NONREACTIVE

## 2017-07-23 ENCOUNTER — Encounter: Payer: Self-pay | Admitting: Obstetrics and Gynecology

## 2017-07-23 DIAGNOSIS — O98811 Other maternal infectious and parasitic diseases complicating pregnancy, first trimester: Secondary | ICD-10-CM

## 2017-07-23 DIAGNOSIS — A749 Chlamydial infection, unspecified: Secondary | ICD-10-CM

## 2017-07-23 HISTORY — DX: Chlamydial infection, unspecified: A74.9

## 2017-07-23 LAB — GC/CHLAMYDIA PROBE AMP
Chlamydia trachomatis, NAA: POSITIVE — AB
Neisseria gonorrhoeae by PCR: NEGATIVE

## 2017-07-23 LAB — URINE CULTURE

## 2017-07-29 ENCOUNTER — Other Ambulatory Visit: Payer: Self-pay

## 2017-07-29 MED ORDER — AZITHROMYCIN 1 G PO PACK: 1.0000 g | PACK | Freq: Once | ORAL | 2 refills | Status: AC

## 2017-08-15 ENCOUNTER — Telehealth: Payer: Self-pay | Admitting: Obstetrics and Gynecology

## 2017-08-15 ENCOUNTER — Encounter: Payer: Medicaid Other | Admitting: Obstetrics and Gynecology

## 2017-08-15 NOTE — Telephone Encounter (Signed)
OB 12 2/7  Pt states she has had a h/a constantly for the last 3 weeks. No dizziness or seeing spots. ? Pt why she waited so long to contact office she states she was hoping the h/a would just go away. She is still able to function normally until last weekend. She has tried tylenol 2 es q6 with no relief. She states she is drinking 5-6 bottles of water a day. She has h/o migraines (8 years ago). She has allergies. Taking zyrtec prn. Advised pt to continue with tylenol es 2 q6. Push fluids. Take zyrtec daily. Use cold compress on forehead. Will send message to [facility].

## 2017-08-15 NOTE — Telephone Encounter (Signed)
The patient called and stated that she has been experiencing severe headaches for 3 weeks now. The patient would like to speak with a nurse as soon as she possibly can. Please advise.

## 2017-08-17 MED ORDER — BUTALBITAL-APAP-CAFFEINE 50-325-40 MG PO CAPS: 1.0000 | ORAL_CAPSULE | Freq: Four times a day (QID) | ORAL | 3 refills | Status: DC | PRN

## 2017-08-17 NOTE — Telephone Encounter (Signed)
If her headaches continue I can prescribe Fiorcet for headaches.  She can also take the occasional dose of Excedrin Migraine, but should limit use in pregnancy unless severe symptoms are not relieved with other medications. She should also try resting in quiet dark room/decreasing stimulationi.

## 2017-08-18 NOTE — Telephone Encounter (Signed)
Pt aware. She states her insurance will not pay for fiorcet but she will pay out of pocket. Aware to let us know if sx don't improve.

## 2017-08-26 ENCOUNTER — Encounter: Payer: Self-pay | Admitting: Obstetrics and Gynecology

## 2017-08-26 ENCOUNTER — Ambulatory Visit (INDEPENDENT_AMBULATORY_CARE_PROVIDER_SITE_OTHER): Payer: Medicaid Other | Admitting: Obstetrics and Gynecology

## 2017-08-26 VITALS — BP 104/67 | HR 89 | Wt 180.8 lb

## 2017-08-26 DIAGNOSIS — Z3482 Encounter for supervision of other normal pregnancy, second trimester: Secondary | ICD-10-CM | POA: Diagnosis not present

## 2017-08-26 DIAGNOSIS — B373 Candidiasis of vulva and vagina: Secondary | ICD-10-CM

## 2017-08-26 DIAGNOSIS — Z8619 Personal history of other infectious and parasitic diseases: Secondary | ICD-10-CM

## 2017-08-26 DIAGNOSIS — Z3A13 13 weeks gestation of pregnancy: Secondary | ICD-10-CM | POA: Diagnosis not present

## 2017-08-26 DIAGNOSIS — R51 Headache: Secondary | ICD-10-CM

## 2017-08-26 DIAGNOSIS — O9989 Other specified diseases and conditions complicating pregnancy, childbirth and the puerperium: Secondary | ICD-10-CM

## 2017-08-26 DIAGNOSIS — B3731 Acute candidiasis of vulva and vagina: Secondary | ICD-10-CM

## 2017-08-26 LAB — POCT URINALYSIS DIPSTICK
Bilirubin, UA: NEGATIVE
Blood, UA: NEGATIVE
GLUCOSE UA: NEGATIVE
LEUKOCYTES UA: NEGATIVE
NITRITE UA: NEGATIVE
ODOR: NEGATIVE
Protein, UA: NEGATIVE
SPEC GRAV UA: 1.015 (ref 1.010–1.025)
Urobilinogen, UA: 0.2 E.U./dL
pH, UA: 7.5 (ref 5.0–8.0)

## 2017-08-26 NOTE — Progress Notes (Signed)
NOB: Patient complains of headaches-Fioricet is helping some.  Complains of vaginal itching-recent history of chlamydia and trichomonas-patient received treatment partner received treatment for trichomonas but his chlamydia testing was negative.  Patient desires MaterniT 21 today.  Considering AFP for spina bifida next visit.  Test of cure chlamydia done today.  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    Physical examination General NAD, Conversant  HEENT Atraumatic; Op clear with mmm.  Normo-cephalic. Pupils reactive. Anicteric sclerae  Thyroid/Neck Smooth without nodularity or enlargement. Normal ROM.  Neck Supple.  Skin No rashes, lesions or ulceration. Normal palpated skin turgor. No nodularity.  Breasts: No masses or discharge.  Symmetric.  No axillary adenopathy.  Lungs: Clear to auscultation.No rales or wheezes. Normal Respiratory effort, no retractions.  Heart: NSR.  No murmurs or rubs appreciated. No periferal edema  Abdomen: Soft.  Non-tender.  No masses.  No HSM. No hernia  Extremities: Moves all appropriately.  Normal ROM for age. No lymphadenopathy.  Neuro: Oriented to PPT.  Normal mood. Normal affect.     Pelvic:   Vulva: Normal appearance.  No lesions.  Vagina: No lesions or abnormalities noted.  Support: Normal pelvic support.  Urethra No masses tenderness or scarring.  Meatus Normal size without lesions or prolapse.  Cervix: Normal appearance.  No lesions.  Anus: Normal exam.  No lesions.  Perineum: Normal exam.  No lesions.        Bimanual   Adnexae: No masses.  Non-tender to palpation.  Uterus: Enlarged. 13 wks  Pos Fht's 148  Non-tender.  Mobile.  AV.  Adnexae: No masses.  Non-tender to palpation.  Cul-de-sac: Negative for abnormality.  Adnexae: No masses.  Non-tender to palpation.         Pelvimetry     Uterus tested to 8 pounds 12 ounces WET PREP: clue cells: absent, KOH (yeast): positive, odor: absent and trichomoniasis: negative Ph:  <  4.5  I have discussed the use of Monistat for monilia vaginitis.

## 2017-08-26 NOTE — Progress Notes (Signed)
NOB- PE- having h/a. Vaginal area is uncomfortable. Needs toc for pos chlamydia on 4/8. Last pap 2017. H/o of abn- 2016.

## 2017-08-28 LAB — PAP IG, CT-NG, RFX HPV ASCU
Chlamydia, Nuc. Acid Amp: NEGATIVE
GONOCOCCUS BY NUCLEIC ACID AMP: NEGATIVE
PAP Smear Comment: 0

## 2017-08-28 LAB — SPECIMEN STATUS REPORT

## 2017-08-31 LAB — MATERNIT21  PLUS CORE+ESS+SCA, BLOOD
Chromosome 13: NEGATIVE
Chromosome 18: NEGATIVE
Chromosome 21: NEGATIVE
Y Chromosome: NOT DETECTED

## 2017-09-29 ENCOUNTER — Other Ambulatory Visit: Payer: Self-pay | Admitting: Obstetrics and Gynecology

## 2017-09-29 DIAGNOSIS — Z3689 Encounter for other specified antenatal screening: Secondary | ICD-10-CM

## 2017-09-30 ENCOUNTER — Encounter: Payer: Medicaid Other | Admitting: Obstetrics and Gynecology

## 2017-10-13 ENCOUNTER — Ambulatory Visit (INDEPENDENT_AMBULATORY_CARE_PROVIDER_SITE_OTHER): Payer: Medicaid Other | Admitting: Obstetrics and Gynecology

## 2017-10-13 ENCOUNTER — Encounter: Payer: Self-pay | Admitting: Obstetrics and Gynecology

## 2017-10-13 ENCOUNTER — Ambulatory Visit (INDEPENDENT_AMBULATORY_CARE_PROVIDER_SITE_OTHER): Payer: Self-pay

## 2017-10-13 VITALS — BP 112/68 | HR 87 | Wt 187.4 lb

## 2017-10-13 DIAGNOSIS — Z363 Encounter for antenatal screening for malformations: Secondary | ICD-10-CM

## 2017-10-13 DIAGNOSIS — Z3482 Encounter for supervision of other normal pregnancy, second trimester: Secondary | ICD-10-CM

## 2017-10-13 DIAGNOSIS — Z3689 Encounter for other specified antenatal screening: Secondary | ICD-10-CM

## 2017-10-13 LAB — POCT URINALYSIS DIPSTICK
BILIRUBIN UA: NEGATIVE
Blood, UA: NEGATIVE
Glucose, UA: NEGATIVE
Ketones, UA: NEGATIVE
Leukocytes, UA: NEGATIVE
Nitrite, UA: NEGATIVE
ODOR: NEGATIVE
PH UA: 6 (ref 5.0–8.0)
Protein, UA: NEGATIVE
Spec Grav, UA: 1.015 (ref 1.010–1.025)
UROBILINOGEN UA: 0.2 U/dL

## 2017-10-13 NOTE — Progress Notes (Signed)
ROB: Patient complains of occasional headaches but otherwise well.  No fetal movement yet.  FAS ultrasound today-normal.  AFP today for spina bifida.

## 2017-10-13 NOTE — Progress Notes (Signed)
ROB- and anatomy scan today. AFP today.

## 2017-10-15 LAB — AFP, SERUM, OPEN SPINA BIFIDA
AFP MoM: 1.55
AFP VALUE AFPOSL: 81.7 ng/mL
Gest. Age on Collection Date: 20 weeks
MATERNAL AGE AT EDD: 26.3 a
OSBR Risk 1 IN: 4785
Test Results:: NEGATIVE
WEIGHT: 187 [lb_av]

## 2017-11-12 ENCOUNTER — Telehealth: Payer: Self-pay | Admitting: Obstetrics and Gynecology

## 2017-11-12 ENCOUNTER — Other Ambulatory Visit: Payer: Self-pay

## 2017-11-12 NOTE — Telephone Encounter (Signed)
Pt states that she is taking 2 pills every 5-6 hours everyday for constant headaches. Should I fill the Rx now or wait for her visit on Monday, 8/5?

## 2017-11-12 NOTE — Telephone Encounter (Signed)
The patients mother called and stated that she is still experiencing migraines, and needs more medication sent in for it she no longer has anymore refills. Please advise.

## 2017-11-13 ENCOUNTER — Other Ambulatory Visit: Payer: Self-pay

## 2017-11-14 ENCOUNTER — Other Ambulatory Visit: Payer: Self-pay

## 2017-11-14 ENCOUNTER — Encounter: Payer: Self-pay | Admitting: Obstetrics and Gynecology

## 2017-11-14 MED ORDER — BUTALBITAL-APAP-CAFFEINE 50-325-40 MG PO CAPS: 1.0000 | ORAL_CAPSULE | Freq: Four times a day (QID) | ORAL | 3 refills | Status: DC | PRN

## 2017-11-14 NOTE — Telephone Encounter (Signed)
Pt was called and informed that her medication had been refilled.

## 2017-11-17 ENCOUNTER — Encounter: Payer: Self-pay | Admitting: Obstetrics and Gynecology

## 2017-11-17 ENCOUNTER — Other Ambulatory Visit: Payer: Self-pay

## 2017-11-17 ENCOUNTER — Ambulatory Visit (INDEPENDENT_AMBULATORY_CARE_PROVIDER_SITE_OTHER): Payer: Medicaid Other | Admitting: Obstetrics and Gynecology

## 2017-11-17 VITALS — BP 90/62 | HR 92 | Wt 198.6 lb

## 2017-11-17 DIAGNOSIS — Z3482 Encounter for supervision of other normal pregnancy, second trimester: Secondary | ICD-10-CM

## 2017-11-17 LAB — POCT URINALYSIS DIPSTICK
Bilirubin, UA: NEGATIVE
Glucose, UA: NEGATIVE
Ketones, UA: NEGATIVE
LEUKOCYTES UA: NEGATIVE
Nitrite, UA: NEGATIVE
Protein, UA: POSITIVE — AB
RBC UA: NEGATIVE
SPEC GRAV UA: 1.02 (ref 1.010–1.025)
Urobilinogen, UA: 0.2 E.U./dL
pH, UA: 6.5 (ref 5.0–8.0)

## 2017-11-17 NOTE — Progress Notes (Signed)
ROB-pt stated that she is doing well no complaints other than having migraines really bad.

## 2017-11-17 NOTE — Progress Notes (Signed)
ROB: Patient continues to have occasional migraines.  She says nothing really helps.  1 hour GCT next visit.

## 2017-12-16 ENCOUNTER — Other Ambulatory Visit: Payer: Medicaid Other

## 2017-12-16 ENCOUNTER — Encounter: Payer: Self-pay | Admitting: Obstetrics and Gynecology

## 2017-12-16 ENCOUNTER — Ambulatory Visit (INDEPENDENT_AMBULATORY_CARE_PROVIDER_SITE_OTHER): Payer: Medicaid Other | Admitting: Obstetrics and Gynecology

## 2017-12-16 VITALS — BP 97/66 | HR 90 | Wt 198.2 lb

## 2017-12-16 DIAGNOSIS — Z3483 Encounter for supervision of other normal pregnancy, third trimester: Secondary | ICD-10-CM

## 2017-12-16 DIAGNOSIS — Z23 Encounter for immunization: Secondary | ICD-10-CM

## 2017-12-16 DIAGNOSIS — W57XXXA Bitten or stung by nonvenomous insect and other nonvenomous arthropods, initial encounter: Secondary | ICD-10-CM

## 2017-12-16 DIAGNOSIS — Z3A29 29 weeks gestation of pregnancy: Secondary | ICD-10-CM

## 2017-12-16 DIAGNOSIS — O23591 Infection of other part of genital tract in pregnancy, first trimester: Secondary | ICD-10-CM

## 2017-12-16 DIAGNOSIS — A5901 Trichomonal vulvovaginitis: Secondary | ICD-10-CM | POA: Insufficient documentation

## 2017-12-16 DIAGNOSIS — O26843 Uterine size-date discrepancy, third trimester: Secondary | ICD-10-CM

## 2017-12-16 LAB — POCT URINALYSIS DIPSTICK OB
Bilirubin, UA: NEGATIVE
Blood, UA: NEGATIVE
Glucose, UA: NEGATIVE
KETONES UA: NEGATIVE
LEUKOCYTES UA: NEGATIVE
NITRITE UA: NEGATIVE
PH UA: 7.5 (ref 5.0–8.0)
PROTEIN: NEGATIVE
Spec Grav, UA: <=1.005 — AB (ref 1.010–1.025)
UROBILINOGEN UA: 0.2 U/dL

## 2017-12-16 MED ORDER — TETANUS-DIPHTH-ACELL PERTUSSIS 5-2.5-18.5 LF-MCG/0.5 IM SUSP
0.5000 mL | Freq: Once | INTRAMUSCULAR | Status: AC
  Administered 2017-12-16: 0.5 mL via INTRAMUSCULAR

## 2017-12-16 NOTE — Progress Notes (Signed)
ROB: Patient c/o rash and itching since Sunday.  Thinks she has bite marks over her body.  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  Exam notes multiple tiny bite marks on arms and legs along with scattered larger bumps, slightly red.  Discussed likely bed bug bites, advised on hydrocortisone cream BID (OTC), and washing clothes/sheets in hot water. For 28 week labs today.  Desires to breastfeed,  Desires unsure method for contraception. Given handout on options.For Tdap today, flu vaccine given, signed blood consent. RTC in 2 weeks. Will also have growth scan for size>dates.
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
## 2017-12-17 LAB — CBC
HEMOGLOBIN: 10.3 g/dL — AB (ref 11.1–15.9)
Hematocrit: 30.9 % — ABNORMAL LOW (ref 34.0–46.6)
MCH: 30 pg (ref 26.6–33.0)
MCHC: 33.3 g/dL (ref 31.5–35.7)
MCV: 90 fL (ref 79–97)
Platelets: 204 10*3/uL (ref 150–450)
RBC: 3.43 x10E6/uL — ABNORMAL LOW (ref 3.77–5.28)
RDW: 12.8 % (ref 12.3–15.4)
WBC: 6.3 10*3/uL (ref 3.4–10.8)

## 2017-12-17 LAB — GLUCOSE, 1 HOUR GESTATIONAL: Gestational Diabetes Screen: 99 mg/dL (ref 65–139)

## 2017-12-17 LAB — RPR: RPR Ser Ql: NONREACTIVE

## 2017-12-19 ENCOUNTER — Telehealth: Payer: Self-pay | Admitting: Obstetrics and Gynecology

## 2017-12-19 NOTE — Telephone Encounter (Signed)
Pt called and explained that it was policy not to schedule appts ahead of time due to each appt is individual and scheduled by the doctor when they needed to come back.  Pt stated that she understood but her job asked her. Pt was informed to speak with DJE on her next visit to see if he could go ahead and note her next couple of appts so the front desk could schedule them ahead of time.

## 2017-12-19 NOTE — Telephone Encounter (Signed)
The patient called wanting to schedule multiple appointments ahead of time, The front office was informed not to schedule multiple appointments ahead of tim until the pt has reached 36 weeks. Please advise.

## 2017-12-31 ENCOUNTER — Ambulatory Visit (INDEPENDENT_AMBULATORY_CARE_PROVIDER_SITE_OTHER): Payer: Medicaid Other

## 2017-12-31 ENCOUNTER — Ambulatory Visit (INDEPENDENT_AMBULATORY_CARE_PROVIDER_SITE_OTHER): Payer: Medicaid Other | Admitting: Obstetrics and Gynecology

## 2017-12-31 VITALS — BP 107/66 | HR 87 | Wt 196.4 lb

## 2017-12-31 DIAGNOSIS — O26843 Uterine size-date discrepancy, third trimester: Secondary | ICD-10-CM | POA: Diagnosis not present

## 2017-12-31 DIAGNOSIS — Z3483 Encounter for supervision of other normal pregnancy, third trimester: Secondary | ICD-10-CM | POA: Diagnosis not present

## 2017-12-31 DIAGNOSIS — Z3A32 32 weeks gestation of pregnancy: Secondary | ICD-10-CM | POA: Diagnosis not present

## 2017-12-31 LAB — POCT URINALYSIS DIPSTICK OB
BILIRUBIN UA: NEGATIVE
Glucose, UA: NEGATIVE
Ketones, UA: NEGATIVE
Leukocytes, UA: NEGATIVE
NITRITE UA: NEGATIVE
POC,PROTEIN,UA: NEGATIVE
RBC UA: NEGATIVE
Spec Grav, UA: 1.015 (ref 1.010–1.025)
UROBILINOGEN UA: 0.2 U/dL
pH, UA: 7 (ref 5.0–8.0)

## 2017-12-31 NOTE — Progress Notes (Signed)
ROB: Doing well, no major complaints. Growth scan done today for size> dates, currently at 45%ile ( 4 lbs 3 oz), normal AFI. Continue routine care. RTC in 2 weeks.

## 2017-12-31 NOTE — Progress Notes (Signed)
   ROB-PT stated that she is doing well no complaints.    

## 2018-01-13 NOTE — Progress Notes (Signed)
Pt here today for ROB visit.  Pt states she is experiencing a slight clear thin discharge, no odor and no itching.

## 2018-01-14 ENCOUNTER — Encounter: Payer: Self-pay | Admitting: Obstetrics and Gynecology

## 2018-01-14 ENCOUNTER — Ambulatory Visit (INDEPENDENT_AMBULATORY_CARE_PROVIDER_SITE_OTHER): Payer: Medicaid Other | Admitting: Obstetrics and Gynecology

## 2018-01-14 VITALS — BP 101/68 | HR 87 | Wt 201.0 lb

## 2018-01-14 DIAGNOSIS — Z3483 Encounter for supervision of other normal pregnancy, third trimester: Secondary | ICD-10-CM

## 2018-01-14 LAB — POCT URINALYSIS DIPSTICK OB
BILIRUBIN UA: NEGATIVE
Blood, UA: NEGATIVE
Glucose, UA: NEGATIVE
Ketones, UA: NEGATIVE
Leukocytes, UA: NEGATIVE
NITRITE UA: NEGATIVE
POC,PROTEIN,UA: NEGATIVE
Spec Grav, UA: 1.01 (ref 1.010–1.025)
Urobilinogen, UA: 0.2 E.U./dL
pH, UA: 7 (ref 5.0–8.0)

## 2018-01-14 NOTE — Progress Notes (Signed)
ROB: Complained of a clear asymptomatic nonodorous vaginal discharge.  Did not want to be checked today.  Cultures next visit.  No other complaints or issues.  Ultrasound reviewed with her.

## 2018-01-23 ENCOUNTER — Ambulatory Visit (INDEPENDENT_AMBULATORY_CARE_PROVIDER_SITE_OTHER): Payer: Medicaid Other | Admitting: Obstetrics and Gynecology

## 2018-01-23 VITALS — BP 101/69 | HR 86 | Wt 200.3 lb

## 2018-01-23 DIAGNOSIS — Z3483 Encounter for supervision of other normal pregnancy, third trimester: Secondary | ICD-10-CM

## 2018-01-23 DIAGNOSIS — O479 False labor, unspecified: Secondary | ICD-10-CM

## 2018-01-23 DIAGNOSIS — Z3A36 36 weeks gestation of pregnancy: Secondary | ICD-10-CM

## 2018-01-23 LAB — POCT URINALYSIS DIPSTICK OB
BILIRUBIN UA: NEGATIVE
Blood, UA: NEGATIVE
GLUCOSE, UA: NEGATIVE
LEUKOCYTES UA: NEGATIVE
Nitrite, UA: NEGATIVE
POC,PROTEIN,UA: NEGATIVE
SPEC GRAV UA: 1.01 (ref 1.010–1.025)
Urobilinogen, UA: 0.2 E.U./dL
pH, UA: 7 (ref 5.0–8.0)

## 2018-01-23 NOTE — Progress Notes (Signed)
ROB: C/o irregular contractions, pain and pressure in the pelvis. 36 week cultures done today. Moderate ketonuria, strongly encouraged adequate hydration. Still unsure of contraceptive desires. RTC in 1 week.

## 2018-01-23 NOTE — Progress Notes (Signed)
ROB-Pt stated that she is having some contractions but they are not regular and having a lot of pain and pressure in the lower pelvic area. No other complaints. Pt had 36 weeks cultures today.

## 2018-01-25 LAB — STREP GP B NAA: STREP GROUP B AG: NEGATIVE

## 2018-01-26 LAB — GC/CHLAMYDIA PROBE AMP
Chlamydia trachomatis, NAA: NEGATIVE
NEISSERIA GONORRHOEAE BY PCR: NEGATIVE

## 2018-01-30 ENCOUNTER — Encounter: Payer: Self-pay | Admitting: Obstetrics and Gynecology

## 2018-01-30 ENCOUNTER — Ambulatory Visit (INDEPENDENT_AMBULATORY_CARE_PROVIDER_SITE_OTHER): Payer: Medicaid Other | Admitting: Obstetrics and Gynecology

## 2018-01-30 VITALS — BP 120/77 | HR 103 | Wt 202.0 lb

## 2018-01-30 DIAGNOSIS — Z3483 Encounter for supervision of other normal pregnancy, third trimester: Secondary | ICD-10-CM

## 2018-01-30 LAB — POCT URINALYSIS DIPSTICK OB
BILIRUBIN UA: NEGATIVE
GLUCOSE, UA: NEGATIVE
KETONES UA: NEGATIVE
Leukocytes, UA: NEGATIVE
Nitrite, UA: NEGATIVE
PH UA: 6.5 (ref 5.0–8.0)
POC,PROTEIN,UA: NEGATIVE
RBC UA: NEGATIVE
SPEC GRAV UA: 1.01 (ref 1.010–1.025)
UROBILINOGEN UA: 0.2 U/dL

## 2018-01-30 NOTE — Progress Notes (Signed)
ROB: Patient with complaints of occasional pelvic pressure, occasional restriction of breathing.  Denies contractions.  Signs and symptoms of labor reviewed.

## 2018-01-30 NOTE — Progress Notes (Signed)
Pt presents today for ROB. Pt is doing well and has no concerns. 

## 2018-02-06 ENCOUNTER — Encounter: Payer: Self-pay | Admitting: Obstetrics and Gynecology

## 2018-02-06 ENCOUNTER — Ambulatory Visit (INDEPENDENT_AMBULATORY_CARE_PROVIDER_SITE_OTHER): Payer: Medicaid Other | Admitting: Obstetrics and Gynecology

## 2018-02-06 VITALS — BP 104/66 | HR 91 | Wt 206.0 lb

## 2018-02-06 DIAGNOSIS — Z3483 Encounter for supervision of other normal pregnancy, third trimester: Secondary | ICD-10-CM

## 2018-02-06 LAB — POCT URINALYSIS DIPSTICK OB
BILIRUBIN UA: NEGATIVE
Blood, UA: NEGATIVE
Glucose, UA: NEGATIVE
KETONES UA: NEGATIVE
Leukocytes, UA: NEGATIVE
NITRITE UA: NEGATIVE
PH UA: 7 (ref 5.0–8.0)
PROTEIN: NEGATIVE
SPEC GRAV UA: 1.01 (ref 1.010–1.025)
UROBILINOGEN UA: 0.2 U/dL

## 2018-02-06 NOTE — Progress Notes (Signed)
ROB: Occasional irregular contractions.  Reports daily fetal movement.  Patient has aches and pains of pregnancy but no worrisome findings.  Signs and symptoms of labor discussed in detail.

## 2018-02-06 NOTE — Progress Notes (Signed)
Pt here today for ROB. Pt is doing well and has no concerns. 

## 2018-02-11 ENCOUNTER — Encounter: Payer: Self-pay | Admitting: Obstetrics and Gynecology

## 2018-02-11 ENCOUNTER — Ambulatory Visit (INDEPENDENT_AMBULATORY_CARE_PROVIDER_SITE_OTHER): Payer: Medicaid Other | Admitting: Obstetrics and Gynecology

## 2018-02-11 VITALS — BP 107/73 | HR 108 | Wt 206.0 lb

## 2018-02-11 DIAGNOSIS — Z3483 Encounter for supervision of other normal pregnancy, third trimester: Secondary | ICD-10-CM

## 2018-02-11 LAB — POCT URINALYSIS DIPSTICK OB
BILIRUBIN UA: NEGATIVE
Glucose, UA: NEGATIVE
Ketones, UA: NEGATIVE
Leukocytes, UA: NEGATIVE
Nitrite, UA: NEGATIVE
PH UA: 6.5 (ref 5.0–8.0)
POC,PROTEIN,UA: NEGATIVE
RBC UA: NEGATIVE
Spec Grav, UA: 1.01 (ref 1.010–1.025)
UROBILINOGEN UA: 0.2 U/dL

## 2018-02-11 NOTE — Progress Notes (Signed)
ROB: She says she is even more uncomfortable than last week with pelvic pressure.  Denies labor contractions.  Reports active fetal movement.

## 2018-02-11 NOTE — Progress Notes (Signed)
Pt here today for ROB. Pt states she is feeling more pressure and pain this week. Otherwise pt feels well.

## 2018-02-19 ENCOUNTER — Ambulatory Visit (INDEPENDENT_AMBULATORY_CARE_PROVIDER_SITE_OTHER): Payer: Medicaid Other | Admitting: Obstetrics and Gynecology

## 2018-02-19 VITALS — BP 93/63 | HR 91 | Wt 203.7 lb

## 2018-02-19 DIAGNOSIS — Z3483 Encounter for supervision of other normal pregnancy, third trimester: Secondary | ICD-10-CM

## 2018-02-19 LAB — POCT URINALYSIS DIPSTICK OB
Bilirubin, UA: NEGATIVE
Blood, UA: NEGATIVE
Glucose, UA: NEGATIVE
Leukocytes, UA: NEGATIVE
Nitrite, UA: NEGATIVE
SPEC GRAV UA: 1.01 (ref 1.010–1.025)
Urobilinogen, UA: 0.2 E.U./dL
pH, UA: 7.5 (ref 5.0–8.0)

## 2018-02-19 NOTE — Progress Notes (Signed)
ROB-Pt stated that she is doing well no complaints.  

## 2018-02-19 NOTE — Progress Notes (Signed)
ROB: Doing well, no complaints. Discussed labor precautions. RTC in 1 week.  

## 2018-02-25 ENCOUNTER — Other Ambulatory Visit (INDEPENDENT_AMBULATORY_CARE_PROVIDER_SITE_OTHER): Payer: Medicaid Other

## 2018-02-25 ENCOUNTER — Other Ambulatory Visit: Payer: Self-pay | Admitting: Obstetrics and Gynecology

## 2018-02-25 ENCOUNTER — Encounter: Payer: Self-pay | Admitting: Obstetrics and Gynecology

## 2018-02-25 ENCOUNTER — Ambulatory Visit (INDEPENDENT_AMBULATORY_CARE_PROVIDER_SITE_OTHER): Payer: Medicaid Other | Admitting: Obstetrics and Gynecology

## 2018-02-25 VITALS — BP 117/75 | HR 90 | Wt 204.0 lb

## 2018-02-25 DIAGNOSIS — O480 Post-term pregnancy: Secondary | ICD-10-CM

## 2018-02-25 DIAGNOSIS — Z3483 Encounter for supervision of other normal pregnancy, third trimester: Secondary | ICD-10-CM

## 2018-02-25 LAB — POCT URINALYSIS DIPSTICK OB
Bilirubin, UA: NEGATIVE
GLUCOSE, UA: NEGATIVE
KETONES UA: NEGATIVE
Leukocytes, UA: NEGATIVE
Nitrite, UA: NEGATIVE
PH UA: 6 (ref 5.0–8.0)
POC,PROTEIN,UA: NEGATIVE
RBC UA: NEGATIVE
SPEC GRAV UA: 1.02 (ref 1.010–1.025)
UROBILINOGEN UA: 0.2 U/dL

## 2018-02-25 NOTE — Progress Notes (Signed)
Pt here today for ROB. Pt is feeling well and is "ready to give birth"

## 2018-02-25 NOTE — Progress Notes (Signed)
ROB: Rare contractions.  Signs and symptoms of labor reviewed.  Question fetal position based on pelvic exam.  BPP today.  Induction scheduled for Monday.  Induction using misoprostol discussed in detail.

## 2018-03-02 ENCOUNTER — Encounter: Payer: Self-pay | Admitting: Anesthesiology

## 2018-03-02 ENCOUNTER — Other Ambulatory Visit: Payer: Self-pay

## 2018-03-02 ENCOUNTER — Inpatient Hospital Stay
Admission: EM | Admit: 2018-03-02 | Discharge: 2018-03-04 | DRG: 807 | Disposition: A | Discharge status: Home / Self Care | Payer: Medicaid Other | Attending: Obstetrics and Gynecology | Admitting: Obstetrics and Gynecology

## 2018-03-02 DIAGNOSIS — Z86711 Personal history of pulmonary embolism: Secondary | ICD-10-CM

## 2018-03-02 DIAGNOSIS — O480 Post-term pregnancy: Secondary | ICD-10-CM

## 2018-03-02 DIAGNOSIS — Z3A40 40 weeks gestation of pregnancy: Secondary | ICD-10-CM

## 2018-03-02 DIAGNOSIS — Z87410 Personal history of cervical dysplasia: Secondary | ICD-10-CM

## 2018-03-02 LAB — TYPE AND SCREEN
ABO/RH(D): O POS
Antibody Screen: NEGATIVE

## 2018-03-02 LAB — COMPREHENSIVE METABOLIC PANEL
ALK PHOS: 210 U/L — AB (ref 38–126)
ALT: 12 U/L (ref 0–44)
ANION GAP: 4 — AB (ref 5–15)
AST: 20 U/L (ref 15–41)
Albumin: 2.9 g/dL — ABNORMAL LOW (ref 3.5–5.0)
BUN: 15 mg/dL (ref 6–20)
CALCIUM: 8.4 mg/dL — AB (ref 8.9–10.3)
CO2: 25 mmol/L (ref 22–32)
Chloride: 108 mmol/L (ref 98–111)
Creatinine, Ser: 0.38 mg/dL — ABNORMAL LOW (ref 0.44–1.00)
GFR calc non Af Amer: >60 mL/min (ref >60)
Glucose, Bld: 109 mg/dL — ABNORMAL HIGH (ref 70–99)
POTASSIUM: 3.5 mmol/L (ref 3.5–5.1)
SODIUM: 137 mmol/L (ref 135–145)
Total Bilirubin: 0.6 mg/dL (ref 0.3–1.2)
Total Protein: 6.3 g/dL — ABNORMAL LOW (ref 6.5–8.1)

## 2018-03-02 LAB — CBC WITH DIFFERENTIAL/PLATELET
Abs Immature Granulocytes: 0.03 10*3/uL (ref 0.00–0.07)
BASOS ABS: 0 10*3/uL (ref 0.0–0.1)
BASOS PCT: 0 %
Eosinophils Absolute: 0.3 10*3/uL (ref 0.0–0.5)
Eosinophils Relative: 4 %
HCT: 33.2 % — ABNORMAL LOW (ref 36.0–46.0)
Hemoglobin: 10.8 g/dL — ABNORMAL LOW (ref 12.0–15.0)
Immature Granulocytes: 0 %
Lymphocytes Relative: 30 %
Lymphs Abs: 2.1 10*3/uL (ref 0.7–4.0)
MCH: 28.8 pg (ref 26.0–34.0)
MCHC: 32.5 g/dL (ref 30.0–36.0)
MCV: 88.5 fL (ref 80.0–100.0)
MONO ABS: 0.5 10*3/uL (ref 0.1–1.0)
Monocytes Relative: 7 %
NRBC: 0 % (ref 0.0–0.2)
Neutro Abs: 4.2 10*3/uL (ref 1.7–7.7)
Neutrophils Relative %: 59 %
Platelets: 241 10*3/uL (ref 150–400)
RBC: 3.75 MIL/uL — AB (ref 3.87–5.11)
RDW: 14.9 % (ref 11.5–15.5)
WBC: 7.2 10*3/uL (ref 4.0–10.5)

## 2018-03-02 MED ORDER — OXYTOCIN 40 UNITS IN LACTATED RINGERS INFUSION - SIMPLE MED
1.0000 m[IU]/min | INTRAVENOUS | Status: DC
  Administered 2018-03-02: 4 m[IU]/min via INTRAVENOUS
  Filled 2018-03-02: qty 1000

## 2018-03-02 MED ORDER — IBUPROFEN 600 MG PO TABS: 600.0000 mg | ORAL_TABLET | Freq: Four times a day (QID) | ORAL | Status: DC

## 2018-03-02 MED ORDER — OXYTOCIN 40 UNITS IN LACTATED RINGERS INFUSION - SIMPLE MED
INTRAVENOUS | Status: AC
  Administered 2018-03-02: 4 m[IU]/min via INTRAVENOUS
  Filled 2018-03-02: qty 1000

## 2018-03-02 MED ORDER — ACETAMINOPHEN 325 MG PO TABS: 650.0000 mg | ORAL_TABLET | ORAL | Status: DC | PRN

## 2018-03-02 MED ORDER — PRENATAL MULTIVITAMIN CH
1.0000 | ORAL_TABLET | Freq: Every day | ORAL | Status: DC
  Administered 2018-03-03: 1 via ORAL
  Filled 2018-03-02: qty 1

## 2018-03-02 MED ORDER — LACTATED RINGERS IV SOLN: 500.0000 mL | Freq: Once | INTRAVENOUS | Status: DC

## 2018-03-02 MED ORDER — OXYTOCIN 10 UNIT/ML IJ SOLN
INTRAMUSCULAR | Status: AC
  Filled 2018-03-02: qty 2

## 2018-03-02 MED ORDER — DIPHENHYDRAMINE HCL 50 MG/ML IJ SOLN: 12.5000 mg | INTRAMUSCULAR | Status: DC | PRN

## 2018-03-02 MED ORDER — FENTANYL 2.5 MCG/ML W/ROPIVACAINE 0.15% IN NS 100 ML EPIDURAL (ARMC): 12.0000 mL/h | EPIDURAL | Status: DC

## 2018-03-02 MED ORDER — WITCH HAZEL-GLYCERIN EX PADS
MEDICATED_PAD | CUTANEOUS | Status: DC | PRN
  Administered 2018-03-03: via TOPICAL
  Filled 2018-03-02: qty 100

## 2018-03-02 MED ORDER — TERBUTALINE SULFATE 1 MG/ML IJ SOLN: 0.2500 mg | Freq: Once | INTRAMUSCULAR | Status: DC | PRN

## 2018-03-02 MED ORDER — OXYTOCIN BOLUS FROM INFUSION
500.0000 mL | Freq: Once | INTRAVENOUS | Status: AC
  Administered 2018-03-02: 500 mL via INTRAVENOUS

## 2018-03-02 MED ORDER — PHENYLEPHRINE 40 MCG/ML (10ML) SYRINGE FOR IV PUSH (FOR BLOOD PRESSURE SUPPORT)
80.0000 ug | PREFILLED_SYRINGE | INTRAVENOUS | Status: DC | PRN
  Filled 2018-03-02: qty 5

## 2018-03-02 MED ORDER — ZOLPIDEM TARTRATE 5 MG PO TABS: 5.0000 mg | ORAL_TABLET | Freq: Every evening | ORAL | Status: DC | PRN

## 2018-03-02 MED ORDER — BUTORPHANOL TARTRATE 1 MG/ML IJ SOLN: 1.0000 mg | INTRAMUSCULAR | Status: DC | PRN

## 2018-03-02 MED ORDER — OXYTOCIN 40 UNITS IN LACTATED RINGERS INFUSION - SIMPLE MED
2.5000 [IU]/h | INTRAVENOUS | Status: DC | PRN
  Filled 2018-03-02: qty 1000

## 2018-03-02 MED ORDER — ONDANSETRON HCL 4 MG/2ML IJ SOLN
4.0000 mg | Freq: Four times a day (QID) | INTRAMUSCULAR | Status: DC | PRN
  Administered 2018-03-02: 4 mg via INTRAVENOUS
  Filled 2018-03-02: qty 2

## 2018-03-02 MED ORDER — MISOPROSTOL 200 MCG PO TABS
ORAL_TABLET | ORAL | Status: AC
  Filled 2018-03-02: qty 4

## 2018-03-02 MED ORDER — AMMONIA AROMATIC IN INHA
RESPIRATORY_TRACT | Status: AC
  Filled 2018-03-02: qty 10

## 2018-03-02 MED ORDER — FENTANYL 2.5 MCG/ML W/ROPIVACAINE 0.15% IN NS 100 ML EPIDURAL (ARMC)
EPIDURAL | Status: AC
  Filled 2018-03-02: qty 100

## 2018-03-02 MED ORDER — LIDOCAINE HCL (PF) 1 % IJ SOLN: 30.0000 mL | INTRAMUSCULAR | Status: DC | PRN

## 2018-03-02 MED ORDER — OXYCODONE-ACETAMINOPHEN 5-325 MG PO TABS
1.0000 | ORAL_TABLET | ORAL | Status: DC | PRN
  Administered 2018-03-03: 2 via ORAL
  Administered 2018-03-03: 1 via ORAL
  Administered 2018-03-03: 2 via ORAL
  Filled 2018-03-02: qty 2
  Filled 2018-03-02: qty 1
  Filled 2018-03-02: qty 2

## 2018-03-02 MED ORDER — SIMETHICONE 80 MG PO CHEW: 80.0000 mg | CHEWABLE_TABLET | ORAL | Status: DC | PRN

## 2018-03-02 MED ORDER — MISOPROSTOL 50MCG HALF TABLET
ORAL_TABLET | ORAL | Status: AC
  Filled 2018-03-02: qty 1

## 2018-03-02 MED ORDER — OXYTOCIN 40 UNITS IN LACTATED RINGERS INFUSION - SIMPLE MED
2.5000 [IU]/h | INTRAVENOUS | Status: DC
  Administered 2018-03-02: 2.5 [IU]/h via INTRAVENOUS

## 2018-03-02 MED ORDER — TETANUS-DIPHTH-ACELL PERTUSSIS 5-2.5-18.5 LF-MCG/0.5 IM SUSP: 0.5000 mL | Freq: Once | INTRAMUSCULAR | Status: DC

## 2018-03-02 MED ORDER — BENZOCAINE-MENTHOL 20-0.5 % EX AERO
1.0000 "application " | INHALATION_SPRAY | CUTANEOUS | Status: DC | PRN
  Filled 2018-03-02: qty 56

## 2018-03-02 MED ORDER — DOCUSATE SODIUM 100 MG PO CAPS
100.0000 mg | ORAL_CAPSULE | Freq: Two times a day (BID) | ORAL | Status: DC
  Administered 2018-03-03 (×2): 100 mg via ORAL
  Filled 2018-03-02 (×2): qty 1

## 2018-03-02 MED ORDER — SOD CITRATE-CITRIC ACID 500-334 MG/5ML PO SOLN: 30.0000 mL | ORAL | Status: DC | PRN

## 2018-03-02 MED ORDER — OXYCODONE-ACETAMINOPHEN 5-325 MG PO TABS
1.0000 | ORAL_TABLET | ORAL | Status: DC | PRN
  Administered 2018-03-02: 1 via ORAL
  Filled 2018-03-02: qty 1

## 2018-03-02 MED ORDER — LACTATED RINGERS IV SOLN
INTRAVENOUS | Status: DC
  Administered 2018-03-02 (×2): via INTRAVENOUS

## 2018-03-02 MED ORDER — LIDOCAINE HCL (PF) 1 % IJ SOLN
INTRAMUSCULAR | Status: AC
  Filled 2018-03-02: qty 30

## 2018-03-02 MED ORDER — DIPHENHYDRAMINE HCL 25 MG PO CAPS: 25.0000 mg | ORAL_CAPSULE | Freq: Four times a day (QID) | ORAL | Status: DC | PRN

## 2018-03-02 MED ORDER — MISOPROSTOL 25 MCG QUARTER TABLET
50.0000 ug | ORAL_TABLET | ORAL | Status: DC
  Administered 2018-03-02 (×2): 50 ug via VAGINAL
  Filled 2018-03-02: qty 1

## 2018-03-02 MED ORDER — LACTATED RINGERS IV SOLN
500.0000 mL | INTRAVENOUS | Status: DC | PRN
  Administered 2018-03-02: 1000 mL via INTRAVENOUS
  Administered 2018-03-02: 500 mL via INTRAVENOUS

## 2018-03-02 MED ORDER — EPHEDRINE 5 MG/ML INJ
10.0000 mg | INTRAVENOUS | Status: DC | PRN
  Filled 2018-03-02: qty 2

## 2018-03-02 NOTE — Progress Notes (Signed)
LABOR NOTE   Hannah Norris 27 y.o.@ at 7248w5d   SUBJECTIVE:  Pt breathing through contractions and doing well without pain medications. OBJECTIVE:  BP 98/71   Pulse 97   Temp 98.7 F (37.1 C) (Oral)   Resp 17   Ht 5\' 1"  (1.549 m)   Wt 92.1 kg   LMP 05/21/2017   BMI 38.36 kg/m  No intake/output data recorded.  She has shown cervical change. CERVIX:  6-7cm 90% 0 station SVE:   Dilation: 7 Effacement (%): 80 Station: 0 Exam by:: Evans MD CONTRACTIONS: regular, every 2 minutes FHR: Fetal heart tracing reviewed. Earlies    Analgesia: Labor support without medications   IUPC and FSE placed  Labs: Lab Results  Component Value Date   WBC 7.2 03/02/2018   HGB 10.8 (L) 03/02/2018   HCT 33.2 (L) 03/02/2018   MCV 88.5 03/02/2018   PLT 241 03/02/2018    ASSESSMENT: 1) Labor curve reviewed.       Progress: Active phase labor.     Membranes: ruptured        Active Problems:   Post-dates pregnancy   PLAN: expectant management  Elonda Huskyavid J. Evans, M.D. 03/02/2018 7:03 PM
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
## 2018-03-02 NOTE — Anesthesia Preprocedure Evaluation (Deleted)
Anesthesia Evaluation  Patient identified by MRN, date of birth, ID band Patient awake    Reviewed: Allergy & Precautions, NPO status , Patient's Chart, lab work & pertinent test results, reviewed documented beta blocker date and time   Airway Mallampati: III  TM Distance: >3 FB     Dental  (+) Chipped   Pulmonary asthma ,           Cardiovascular      Neuro/Psych PSYCHIATRIC DISORDERS Depression    GI/Hepatic GERD  Controlled,  Endo/Other  Morbid obesity  Renal/GU      Musculoskeletal   Abdominal   Peds  Hematology  (+) anemia ,   Anesthesia Other Findings Hx of PE.  Reproductive/Obstetrics                             Anesthesia Physical Anesthesia Plan  ASA: III  Anesthesia Plan: Epidural   Post-op Pain Management:    Induction:   PONV Risk Score and Plan:   Airway Management Planned:   Additional Equipment:   Intra-op Plan:   Post-operative Plan:   Informed Consent: I have reviewed the patients History and Physical, chart, labs and discussed the procedure including the risks, benefits and alternatives for the proposed anesthesia with the patient or authorized representative who has indicated his/her understanding and acceptance.     Plan Discussed with: CRNA  Anesthesia Plan Comments:         Anesthesia Quick Evaluation

## 2018-03-03 LAB — RPR: RPR: NONREACTIVE

## 2018-03-03 MED ORDER — COCONUT OIL OIL
1.0000 "application " | TOPICAL_OIL | Status: DC | PRN
  Administered 2018-03-03: 1 via TOPICAL
  Filled 2018-03-03: qty 120

## 2018-03-03 MED ORDER — ACETAMINOPHEN 325 MG PO TABS
650.0000 mg | ORAL_TABLET | ORAL | Status: DC | PRN
  Administered 2018-03-03 – 2018-03-04 (×2): 650 mg via ORAL
  Filled 2018-03-03 (×2): qty 2

## 2018-03-03 NOTE — Lactation Note (Signed)
This note was copied from a baby's chart. Lactation Consultation Note  Patient Name: Hannah Norris WUJWJ'XToday's Date: 03/03/2018   During my LC rounds. Mom said that "feedings were going well" and she declined needing any education of assistance with breastfeeding other than questions about return to work. I did answer those questions. She denied needing pump.  She has LC contact info as well as [employer] info.      Maternal Data    Feeding Feeding Type: (encouraged feed)  LATCH Score                   Interventions    Lactation Tools Discussed/Used     Consult Status      Sunday CornSandra Clark Shukri Nistler 03/03/2018, 10:53 AM

## 2018-03-03 NOTE — Plan of Care (Signed)
Vs stable; up ad lib; tolerating regular diet; taking percocet for pain control; also alternating ice and heat to abdomen to help with pain control; iv was able to be converted to SL; breastfeeding and doing well

## 2018-03-03 NOTE — Progress Notes (Signed)
Patient ID: Hannah Norris, female   DOB: 09/28/1990, 27 y.o.   MRN: [id number]   Progress Note - Vaginal Delivery  Hannah Norris is a 27 y.o. G4P4004 now PP day 1 s/p Vaginal, Spontaneous .   Subjective:  The patient reports no complaints, up ad lib, voiding and tolerating PO   Objective:  Vital signs in last 24 hours: Temp:  [97.9 F (36.6 C)-98.7 F (37.1 C)] 98 F (36.7 C) (11/19 1134) Pulse Rate:  [61-97] 78 (11/19 1134) Resp:  [14-20] 18 (11/19 1134) BP: (98-114)/(60-74) 102/65 (11/19 1134) SpO2:  [95 %-99 %] 99 % (11/19 0829)  Physical Exam:  General: alert, cooperative and no distress Lochia: appropriate Uterine Fundus: firm DVT Evaluation: No evidence of DVT seen on physical exam.    Data Review Recent Labs    03/02/18 0748  HGB 10.8*  HCT 33.2*    Assessment/Plan: Active Problems:   Post-dates pregnancy   Plan for discharge tomorrow D/C Percocet - Tylenol PRN  -- Continue routine PP care.     Elonda Huskyavid J. Glorious Flicker, M.D. 03/03/2018 1:22 PM

## 2018-03-04 NOTE — Plan of Care (Signed)
Vs stable; up ad lib; breastfeeding well; tolerating regular diet; taking tylenol for pain control

## 2018-03-04 NOTE — Progress Notes (Signed)
Provided and reviewed discharge paperwork. Verified understanding by use of teach back method, pt verbalized understanding as well. Follow up appointment proivded. Discharge home with infant, significant other to drive. Taken to visitor entrance via wheelchair by hospital volunteer.

## 2018-03-04 NOTE — Lactation Note (Signed)
This note was copied from a baby's chart. Lactation Consultation Note  Patient Name: Hannah Norris ZOXWR'UToday's Date: 03/04/2018     Maternal Data    Feeding Feeding Type: Breast Fed  LATCH Score                   Interventions    Lactation Tools Discussed/Used     Consult Status  LC stopped in room during morning rounds to check the progress of breastfeeding. Mother states that infant is continuing to breastfeed well. Mother states that infant keeps tucking her lip in and wants to know suggestions on what to do to decrease the discomfort. LC suggested that mother roll out top lip using her finger after infant has latched. LC explained that she may have to do this every time infant latches and to also do the same and flange top lip if using a bottle or pacifier. Mother has not required any assistance, is an experienced breastfeeder and denies any additional questions or concerns.     Arlyss Gandylicia Deryn Massengale 03/04/2018, 11:16 AM

## 2018-03-04 NOTE — Discharge Summary (Signed)
                              Discharge Summary  Date of Admission: 03/02/2018  Date of Discharge: 03/04/2018  Admitting Diagnosis: Postdates IUP at 2366w5d  Mode of Delivery: normal spontaneous vaginal delivery                 Discharge Diagnosis: No other diagnosis   Intrapartum Procedures: Atificial rupture of membranes   Post partum procedures:   Complications: none                      Discharge Day SOAP Note:  Progress Note - Vaginal Delivery  Anne Hahndriana Langlois is a 27 y.o. G4P4004 now PP day 2 s/p Vaginal, Spontaneous . Delivery was uncomplicated.  Subjective  The patient has the following complaints: has no unusual complaints  Pain is controlled with current medications.   Patient is urinating without difficulty.  She is ambulating well.     Objective  Vital signs: BP 99/72 (BP Location: Right Arm)   Pulse 79   Temp 98.4 F (36.9 C) (Oral)   Resp 18   Ht 5\' 1"  (1.549 m)   Wt 92.1 kg   LMP 05/21/2017   SpO2 99%   Breastfeeding? Unknown   BMI 38.36 kg/m   Physical Exam: Gen: NAD Fundus Fundal Tone: Firm  Lochia Amount: Small  Perineum Appearance: Intact     Data Review Labs: CBC Latest Ref Rng & Units 03/02/2018 12/16/2017 07/21/2017  WBC 4.0 - 10.5 K/uL 7.2 6.3 5.6  Hemoglobin 12.0 - 15.0 g/dL 10.8(L) 10.3(L) 12.0  Hematocrit 36.0 - 46.0 % 33.2(L) 30.9(L) 37.0  Platelets 150 - 400 K/uL 241 204 308   O POS  Assessment/Plan  Active Problems:   Post-dates pregnancy    Plan for discharge today.   Discharge Instructions: Per After Visit Summary. Activity: Advance as tolerated. Pelvic rest for 6 weeks.  Also refer to After Visit Summary Diet: Regular Medications: Allergies as of 03/04/2018      Reactions   Ibuprofen Shortness Of Breath   Keflex [cephalexin] Swelling   Thrush   Lactose Intolerance (gi)    Upset stomach    Lactose Nausea And Vomiting      Medication List    STOP taking these medications   Butalbital-APAP-Caffeine  50-325-40 MG capsule   cetirizine 10 MG tablet Commonly known as:  ZYRTEC   ferrous sulfate 325 (65 FE) MG tablet     TAKE these medications   prenatal multivitamin Tabs tablet Take 1 tablet by mouth daily at 12 noon.      Outpatient follow up:  Follow-up Information    Linzie CollinEvans, Tome Wilson James, MD. Go on 04/16/2018.   Specialty:  Obstetrics and Gynecology Why:  09:00am Contact information: [address] [place] [address] [phone]          Postpartum contraception: Will discuss at first office visit post-partum  Discharged Condition: good  Discharged to: home  Newborn Data: Disposition:home with mother  Apgars: APGAR (1 MIN): 8   APGAR (5 MINS): 9   APGAR (10 MINS):    Baby Feeding: Breast    Elonda Huskyavid J. Branae Crail, M.D. 03/04/2018 2:23 PM

## 2018-04-16 ENCOUNTER — Encounter: Payer: Medicaid Other | Admitting: Obstetrics and Gynecology

## 2018-04-17 ENCOUNTER — Encounter: Payer: Self-pay | Admitting: Obstetrics and Gynecology

## 2018-04-17 ENCOUNTER — Ambulatory Visit (INDEPENDENT_AMBULATORY_CARE_PROVIDER_SITE_OTHER): Payer: Medicaid Other | Admitting: Obstetrics and Gynecology

## 2018-04-17 DIAGNOSIS — N760 Acute vaginitis: Secondary | ICD-10-CM | POA: Diagnosis not present

## 2018-04-17 DIAGNOSIS — Z1389 Encounter for screening for other disorder: Secondary | ICD-10-CM | POA: Diagnosis not present

## 2018-04-17 DIAGNOSIS — B9689 Other specified bacterial agents as the cause of diseases classified elsewhere: Secondary | ICD-10-CM

## 2018-04-17 MED ORDER — NORETHINDRONE 0.35 MG PO TABS: 1.0000 | ORAL_TABLET | Freq: Every day | ORAL | 11 refills | Status: DC

## 2018-04-17 MED ORDER — METRONIDAZOLE 500 MG PO TABS: 500.0000 mg | ORAL_TABLET | Freq: Two times a day (BID) | ORAL | 0 refills | Status: AC
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
## 2018-04-17 NOTE — Progress Notes (Signed)
Patient is here for her 6 week PP visit. Patient denies having intercourse at this time. She is not using BC. Patient states that she is still having some vaginal bleeding with an odor. She states that she has not had a menstrual cycle yet.  PHQ 9 was negative today.

## 2018-05-19 ENCOUNTER — Encounter: Payer: Self-pay | Admitting: Obstetrics and Gynecology

## 2018-05-19 ENCOUNTER — Ambulatory Visit (INDEPENDENT_AMBULATORY_CARE_PROVIDER_SITE_OTHER): Payer: Medicaid Other | Admitting: Obstetrics and Gynecology

## 2018-05-19 VITALS — BP 112/77 | HR 78 | Ht 61.0 in | Wt 174.2 lb

## 2018-05-19 DIAGNOSIS — N760 Acute vaginitis: Secondary | ICD-10-CM | POA: Diagnosis not present

## 2018-05-19 DIAGNOSIS — B9689 Other specified bacterial agents as the cause of diseases classified elsewhere: Secondary | ICD-10-CM

## 2018-05-19 MED ORDER — METRONIDAZOLE 500 MG PO TABS: 500.0000 mg | ORAL_TABLET | Freq: Two times a day (BID) | ORAL | 0 refills | Status: AC

## 2018-05-19 NOTE — Progress Notes (Signed)
Patient comes in today for vaginal discharge. She has a history of BV. Patient states that her discharge has a sweet odor.
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
## 2018-07-17 ENCOUNTER — Encounter: Payer: Medicaid Other | Admitting: Obstetrics and Gynecology

## 2018-07-21 ENCOUNTER — Encounter: Payer: Medicaid Other | Admitting: Obstetrics and Gynecology

## 2018-09-14 ENCOUNTER — Telehealth: Payer: Self-pay

## 2018-09-14 NOTE — Telephone Encounter (Signed)
Pt called no answer LM via voicemail to call the office for prescreening. 

## 2018-09-15 ENCOUNTER — Encounter: Payer: Self-pay | Admitting: Obstetrics and Gynecology

## 2018-11-11 ENCOUNTER — Encounter: Payer: Self-pay | Admitting: Obstetrics and Gynecology

## 2018-11-17 ENCOUNTER — Encounter: Payer: Medicaid Other | Admitting: Obstetrics and Gynecology

## 2018-11-17 ENCOUNTER — Ambulatory Visit (INDEPENDENT_AMBULATORY_CARE_PROVIDER_SITE_OTHER): Payer: Medicaid Other | Admitting: Obstetrics and Gynecology

## 2018-11-17 ENCOUNTER — Encounter: Payer: Self-pay | Admitting: Obstetrics and Gynecology

## 2018-11-17 ENCOUNTER — Other Ambulatory Visit: Payer: Self-pay

## 2018-11-17 VITALS — BP 130/76 | HR 82 | Ht 61.0 in | Wt 181.5 lb

## 2018-11-17 DIAGNOSIS — B3731 Acute candidiasis of vulva and vagina: Secondary | ICD-10-CM

## 2018-11-17 DIAGNOSIS — B373 Candidiasis of vulva and vagina: Secondary | ICD-10-CM

## 2018-11-17 MED ORDER — FLUCONAZOLE 150 MG PO TABS: ORAL_TABLET | ORAL | 0 refills | Status: DC
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
## 2018-11-22 LAB — GC/CHLAMYDIA PROBE AMP
Chlamydia trachomatis, NAA: NEGATIVE
Neisseria Gonorrhoeae by PCR: NEGATIVE

## 2018-12-10 ENCOUNTER — Encounter: Payer: Self-pay | Admitting: Obstetrics and Gynecology

## 2018-12-17 ENCOUNTER — Encounter: Payer: Self-pay | Admitting: Obstetrics and Gynecology

## 2018-12-30 ENCOUNTER — Other Ambulatory Visit: Payer: Self-pay

## 2018-12-30 ENCOUNTER — Ambulatory Visit (INDEPENDENT_AMBULATORY_CARE_PROVIDER_SITE_OTHER): Payer: Medicaid Other | Admitting: Obstetrics and Gynecology

## 2018-12-30 ENCOUNTER — Encounter: Payer: Self-pay | Admitting: Obstetrics and Gynecology

## 2018-12-30 VITALS — BP 113/73 | HR 93 | Ht 61.0 in | Wt 179.9 lb

## 2018-12-30 DIAGNOSIS — Z0000 Encounter for general adult medical examination without abnormal findings: Secondary | ICD-10-CM

## 2018-12-30 DIAGNOSIS — Z01419 Encounter for gynecological examination (general) (routine) without abnormal findings: Secondary | ICD-10-CM | POA: Diagnosis not present
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
## 2018-12-30 NOTE — Progress Notes (Signed)
Patient comes in today for her annual exam. She is not due for anything. She is not taking BC. They are using condoms for protection.

## 2019-03-04 ENCOUNTER — Other Ambulatory Visit: Payer: Self-pay

## 2019-03-04 ENCOUNTER — Ambulatory Visit (INDEPENDENT_AMBULATORY_CARE_PROVIDER_SITE_OTHER): Payer: Medicaid Other | Admitting: Obstetrics and Gynecology

## 2019-03-04 ENCOUNTER — Other Ambulatory Visit (HOSPITAL_COMMUNITY)
Admission: RE | Admit: 2019-03-04 | Discharge: 2019-03-04 | Disposition: A | Discharge status: Home / Self Care | Payer: Medicaid Other | Source: Ambulatory Visit | Attending: Obstetrics and Gynecology | Admitting: Obstetrics and Gynecology

## 2019-03-04 ENCOUNTER — Encounter: Payer: Self-pay | Admitting: Obstetrics and Gynecology

## 2019-03-04 VITALS — BP 108/72 | HR 89 | Ht 61.0 in | Wt 181.8 lb

## 2019-03-04 DIAGNOSIS — N898 Other specified noninflammatory disorders of vagina: Secondary | ICD-10-CM | POA: Diagnosis not present

## 2019-03-04 DIAGNOSIS — N938 Other specified abnormal uterine and vaginal bleeding: Secondary | ICD-10-CM

## 2019-03-04 NOTE — Progress Notes (Signed)
Patient comes in today for irregular cycles since September.  Patient is still nursing.
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
## 2019-03-05 LAB — CERVICOVAGINAL ANCILLARY ONLY
Bacterial Vaginitis (gardnerella): NEGATIVE
Candida Glabrata: NEGATIVE
Candida Vaginitis: NEGATIVE
Chlamydia: NEGATIVE
Comment: NEGATIVE
Comment: NEGATIVE
Comment: NEGATIVE
Comment: NEGATIVE
Comment: NEGATIVE
Comment: NORMAL
Neisseria Gonorrhea: NEGATIVE
Trichomonas: NEGATIVE

## 2019-06-23 ENCOUNTER — Ambulatory Visit (INDEPENDENT_AMBULATORY_CARE_PROVIDER_SITE_OTHER): Payer: Medicaid Other | Admitting: Obstetrics and Gynecology

## 2019-06-23 ENCOUNTER — Other Ambulatory Visit: Payer: Self-pay

## 2019-06-23 ENCOUNTER — Encounter: Payer: Self-pay | Admitting: Obstetrics and Gynecology

## 2019-06-23 ENCOUNTER — Other Ambulatory Visit (HOSPITAL_COMMUNITY)
Admission: RE | Admit: 2019-06-23 | Discharge: 2019-06-23 | Disposition: A | Discharge status: Home / Self Care | Payer: Medicaid Other | Source: Ambulatory Visit | Attending: Obstetrics and Gynecology | Admitting: Obstetrics and Gynecology

## 2019-06-23 VITALS — BP 108/73 | HR 76 | Ht 61.0 in | Wt 183.6 lb

## 2019-06-23 DIAGNOSIS — Z3009 Encounter for other general counseling and advice on contraception: Secondary | ICD-10-CM | POA: Diagnosis not present

## 2019-06-23 DIAGNOSIS — N898 Other specified noninflammatory disorders of vagina: Secondary | ICD-10-CM

## 2019-06-23 DIAGNOSIS — N920 Excessive and frequent menstruation with regular cycle: Secondary | ICD-10-CM | POA: Diagnosis not present

## 2019-06-23 MED ORDER — NORETHINDRONE 0.35 MG PO TABS: 1.0000 | ORAL_TABLET | Freq: Every day | ORAL | 4 refills | Status: AC
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
## 2019-06-25 LAB — CERVICOVAGINAL ANCILLARY ONLY
Bacterial Vaginitis (gardnerella): NEGATIVE
Candida Glabrata: NEGATIVE
Candida Vaginitis: NEGATIVE
Comment: NEGATIVE
Comment: NEGATIVE
Comment: NEGATIVE

## 2019-07-02 ENCOUNTER — Telehealth: Payer: Self-pay | Admitting: Obstetrics and Gynecology

## 2019-07-02 NOTE — Telephone Encounter (Signed)
LM for patient that results are negative.

## 2019-07-02 NOTE — Telephone Encounter (Signed)
Returning a missed phone call.

## 2019-07-30 ENCOUNTER — Encounter: Payer: Self-pay | Admitting: Surgical

## 2019-12-30 ENCOUNTER — Encounter: Payer: Medicaid Other | Admitting: Obstetrics and Gynecology

## 2020-01-07 ENCOUNTER — Encounter: Payer: Medicaid Other | Admitting: Obstetrics and Gynecology

## 2020-01-21 ENCOUNTER — Encounter: Payer: Medicaid Other | Admitting: Obstetrics and Gynecology

## 2020-02-09 ENCOUNTER — Telehealth: Payer: Self-pay

## 2020-02-09 ENCOUNTER — Encounter: Payer: Medicaid Other | Admitting: Obstetrics and Gynecology

## 2020-02-10 NOTE — Telephone Encounter (Signed)
error 

## 2020-02-14 ENCOUNTER — Encounter: Payer: Medicaid Other | Admitting: Obstetrics and Gynecology

## 2020-03-01 ENCOUNTER — Encounter: Payer: Medicaid Other | Admitting: Obstetrics and Gynecology

## 2020-03-29 ENCOUNTER — Encounter: Payer: Medicaid Other | Admitting: Obstetrics and Gynecology

## 2020-11-23 ENCOUNTER — Telehealth: Payer: Medicaid Other | Admitting: Obstetrics and Gynecology

## 2020-11-23 NOTE — Telephone Encounter (Signed)
Pt states a bill showed credit report- pt states active insurance every visit and is asking questions about billing. Please Advise.

## 2020-11-24 NOTE — Telephone Encounter (Signed)
Pt aware she has bad debt of 215.00- majority is a  u/s from 7/19.  Pt states she had medicaid at that time. I see no insurance attached to the charge. Advised pt to contact credit agency as  I am unable to change anything once it goes to the credit agency.   Pt voiced understanding.

## 2024-04-18 ENCOUNTER — Ambulatory Visit
Admission: RE | Admit: 2024-04-18 | Discharge: 2024-04-18 | Disposition: A | Discharge status: Home / Self Care | Source: Ambulatory Visit | Attending: Family Medicine | Admitting: Family Medicine

## 2024-04-18 VITALS — BP 114/79 | HR 90 | Temp 98.5°F | Resp 18

## 2024-04-18 DIAGNOSIS — L299 Pruritus, unspecified: Secondary | ICD-10-CM

## 2024-04-18 DIAGNOSIS — L500 Allergic urticaria: Secondary | ICD-10-CM | POA: Diagnosis not present

## 2024-04-18 MED ORDER — METHYLPREDNISOLONE SODIUM SUCC 125 MG IJ SOLR
125.0000 mg | Freq: Once | INTRAMUSCULAR | Status: AC
  Administered 2024-04-18: 125 mg via INTRAMUSCULAR
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
## 2024-04-18 NOTE — Progress Notes (Signed)
 [facility] Dermatologic Encounter This medical encounter was conducted virtually using [email or web address]  TeleHealth protocols.  Patient ID: Hannah Norris is a 34 y.o. female who presents by video interaction for evaluation.  I have identified myself to the patient and conveyed my credentials to [employer].  Patient has signed informed consent on file in medical record.  Present on Video Call: Is there someone else in the room? No..  Assessment/Plan:    Diagnoses and all orders for this visit:  Hives  Other orders -     hydrOXYzine (ATARAX) 25 MG tablet; Take 1 tablet (25 mg total) by mouth Three (3) times a day as needed. -     predniSONE  (DELTASONE ) 20 MG tablet; Take 1 tablet (20 mg total) by mouth daily. -     cetirizine (ZYRTEC) 10 MG tablet; Take 1 tablet (10 mg total) by mouth daily. -     predniSONE  (DELTASONE ) 20 MG tablet; Take 1 tablet (20 mg total) by mouth two (2) times a day.   Follow up in person if needed-ER precautions given Pt to monitor sx closely  -- Discussed the new prescription noted above, including potential side effects, drug interactions, instructions for taking the medication, and the consequences of not taking it. -- Patient verbalized an understanding of today's assessment and recommendations, as well as the purpose of ongoing medications.  Follow-up as Needed     Medication adherence and barriers to the treatment plan have been addressed. Opportunities to optimize healthy behaviors have been discussed. Patient / caregiver voiced understanding.     Subjective:   HPI Hannah Norris is 34 y.o. and presents today in the [facility] with dermatologic symptoms.  The PCP for this patient is Losapio, Delia R, MD.  Pt with hives for 2 days after eating at a new restaurant.  Hives are spreading on face and body.  No sob or cp or wheezing or throat swelling.  Has felt that in the past but not this time. Has tried  benadryl  and zyrtec alone with no relief. Can tolerate steroids. LMP 3.5 weeks ago    ROS Review of Systems   All other ROS per HPI.  I have reviewed the problem list, past medical history, past family history, medications, and allergies and have updated/reconciled them if needed.      Objective:  Physical Exam As part of this Video Visit, no in-person exam was conducted. Video interaction permitted the following observations.  General: No acute distress.  RESP: Relaxed respiratory effort. No conversational dyspnea.  SKIN: + urticarial rashes noted on face and limbs, no facial swelling noted.  No palm/sole involvement.  No skin peeling.   NEURO: Normal coordination.  No tremors observed. PSYCH: Alert and oriented.  Speech fluent and sensible.  Calm affect.       The patient reports they are physically located in [place]  and is currently: at home. I conducted a audio/video visit. I spent  41m 32s on the video call with the patient. I spent an additional 3 minutes on pre- and post-visit activities on the date of service .

## 2024-04-18 NOTE — Discharge Instructions (Addendum)
 Start your oral steroids on Tuesday morning. Use hydroxyzine as needed for the itching.

## 2024-04-18 NOTE — ED Triage Notes (Signed)
 Pt reports hives all over the body since last night. Pt reports started after she ate, she is not sure if was any of the seasoning as she gets hives when she eats beef of pork. Benadryl  and Zyrtec gives no relief.
